# Patient Record
Sex: Male | Born: 1965 | Race: Asian | Hispanic: No | Marital: Married | Smoking: Never smoker
Health system: Southern US, Community
[De-identification: ages and names within clinical notes are randomized; demographics above are authoritative.]

## PROBLEM LIST (undated history)

## (undated) DIAGNOSIS — N200 Calculus of kidney: Secondary | ICD-10-CM

## (undated) DIAGNOSIS — E785 Hyperlipidemia, unspecified: Secondary | ICD-10-CM

## (undated) DIAGNOSIS — E119 Type 2 diabetes mellitus without complications: Secondary | ICD-10-CM

## (undated) HISTORY — DX: Hyperlipidemia, unspecified: E78.5

## (undated) HISTORY — DX: Type 2 diabetes mellitus without complications: E11.9

---

## 2003-05-10 ENCOUNTER — Emergency Department (HOSPITAL_COMMUNITY): Admission: EM | Admit: 2003-05-10 | Discharge: 2003-05-11 | Payer: Self-pay | Admitting: Emergency Medicine

## 2008-08-18 ENCOUNTER — Emergency Department (HOSPITAL_COMMUNITY): Admission: EM | Admit: 2008-08-18 | Discharge: 2008-08-18 | Payer: Self-pay | Admitting: Emergency Medicine

## 2011-08-12 LAB — CBC: WBC: 5.7

## 2011-08-12 LAB — URINALYSIS, ROUTINE W REFLEX MICROSCOPIC
Bilirubin Urine: NEGATIVE
Glucose, UA: NEGATIVE
Ketones, ur: 15 — AB
Nitrite: NEGATIVE
Protein, ur: 30 — AB
Specific Gravity, Urine: 1.024
Urobilinogen, UA: 0.2

## 2011-08-12 LAB — DIFFERENTIAL
Basophils Absolute: 0
Basophils Relative: 1
Eosinophils Relative: 2
Monocytes Relative: 6

## 2011-08-12 LAB — URINE MICROSCOPIC-ADD ON

## 2011-08-12 LAB — OCCULT BLOOD X 1 CARD TO LAB, STOOL: Fecal Occult Bld: NEGATIVE

## 2013-02-20 ENCOUNTER — Emergency Department (HOSPITAL_COMMUNITY)
Admission: EM | Admit: 2013-02-20 | Discharge: 2013-02-21 | Disposition: A | Discharge status: Home / Self Care | Payer: BC Managed Care – PPO | Attending: Emergency Medicine | Admitting: Emergency Medicine

## 2013-02-20 ENCOUNTER — Encounter (HOSPITAL_COMMUNITY): Payer: Self-pay | Admitting: *Deleted

## 2013-02-20 DIAGNOSIS — R110 Nausea: Secondary | ICD-10-CM | POA: Insufficient documentation

## 2013-02-20 DIAGNOSIS — N200 Calculus of kidney: Secondary | ICD-10-CM

## 2013-02-20 DIAGNOSIS — R319 Hematuria, unspecified: Secondary | ICD-10-CM | POA: Insufficient documentation

## 2013-02-20 HISTORY — DX: Calculus of kidney: N20.0

## 2013-02-20 LAB — COMPREHENSIVE METABOLIC PANEL
ALT: 31 U/L (ref 0–53)
AST: 21 U/L (ref 0–37)
Albumin: 4.3 g/dL (ref 3.5–5.2)
Alkaline Phosphatase: 44 U/L (ref 39–117)
CO2: 28 mEq/L (ref 19–32)
Calcium: 9.2 mg/dL (ref 8.4–10.5)
Chloride: 104 mEq/L (ref 96–112)
Creatinine, Ser: 0.86 mg/dL (ref 0.50–1.35)
GFR calc non Af Amer: >90 mL/min (ref >90)
Glucose, Bld: 156 mg/dL — ABNORMAL HIGH (ref 70–99)
Potassium: 3.8 mEq/L (ref 3.5–5.1)

## 2013-02-20 LAB — URINALYSIS, MICROSCOPIC ONLY
Bilirubin Urine: NEGATIVE
Ketones, ur: NEGATIVE mg/dL
Nitrite: NEGATIVE
Specific Gravity, Urine: 1.024 (ref 1.005–1.030)
Urobilinogen, UA: 0.2 mg/dL (ref 0.0–1.0)

## 2013-02-20 LAB — CBC WITH DIFFERENTIAL/PLATELET
Basophils Absolute: 0.1 10*3/uL (ref 0.0–0.1)
Eosinophils Absolute: 0.1 10*3/uL (ref 0.0–0.7)
HCT: 43.9 % (ref 39.0–52.0)
Hemoglobin: 15.6 g/dL (ref 13.0–17.0)
MCV: 80.1 fL (ref 78.0–100.0)
RDW: 12.2 % (ref 11.5–15.5)

## 2013-02-20 MED ORDER — HYDROMORPHONE HCL PF 1 MG/ML IJ SOLN
0.5000 mg | Freq: Once | INTRAMUSCULAR | Status: AC
  Administered 2013-02-20: 0.5 mg via INTRAVENOUS
  Filled 2013-02-20: qty 1

## 2013-02-20 MED ORDER — ONDANSETRON 4 MG PO TBDP
8.0000 mg | ORAL_TABLET | Freq: Once | ORAL | Status: AC
  Administered 2013-02-20: 8 mg via ORAL
  Filled 2013-02-20: qty 2

## 2013-02-20 MED ORDER — ONDANSETRON HCL 4 MG/2ML IJ SOLN
4.0000 mg | Freq: Once | INTRAMUSCULAR | Status: AC
  Administered 2013-02-20: 4 mg via INTRAVENOUS
  Filled 2013-02-20: qty 2

## 2013-02-20 MED ORDER — FENTANYL CITRATE 0.05 MG/ML IJ SOLN
50.0000 ug | Freq: Once | INTRAMUSCULAR | Status: AC
  Administered 2013-02-20: 50 ug via NASAL
  Filled 2013-02-20: qty 2

## 2013-02-20 NOTE — ED Notes (Signed)
Pt c/o right flank pain and hematuria x 1 day.  States he has a hx of kidney stones.  Denies n/v.

## 2013-02-21 ENCOUNTER — Emergency Department (HOSPITAL_COMMUNITY): Payer: BC Managed Care – PPO

## 2013-02-21 ENCOUNTER — Encounter (HOSPITAL_COMMUNITY)
Admission: RE | Disposition: A | Discharge status: Home / Self Care | Payer: Self-pay | Source: Ambulatory Visit | Attending: Urology

## 2013-02-21 ENCOUNTER — Ambulatory Visit (HOSPITAL_COMMUNITY): Admission: AD | Admit: 2013-02-21 | Payer: BC Managed Care – PPO | Source: Ambulatory Visit | Admitting: Urology

## 2013-02-21 ENCOUNTER — Ambulatory Visit (HOSPITAL_COMMUNITY)
Admission: RE | Admit: 2013-02-21 | Discharge: 2013-02-21 | Disposition: A | Discharge status: Home / Self Care | Payer: BC Managed Care – PPO | Source: Ambulatory Visit | Attending: Urology | Admitting: Urology

## 2013-02-21 ENCOUNTER — Encounter (HOSPITAL_COMMUNITY): Payer: Self-pay | Admitting: *Deleted

## 2013-02-21 ENCOUNTER — Ambulatory Visit (HOSPITAL_COMMUNITY): Payer: BC Managed Care – PPO

## 2013-02-21 ENCOUNTER — Other Ambulatory Visit: Payer: Self-pay | Admitting: Urology

## 2013-02-21 DIAGNOSIS — N201 Calculus of ureter: Secondary | ICD-10-CM | POA: Insufficient documentation

## 2013-02-21 DIAGNOSIS — Z79899 Other long term (current) drug therapy: Secondary | ICD-10-CM | POA: Insufficient documentation

## 2013-02-21 SURGERY — LITHOTRIPSY, ESWL
Anesthesia: LOCAL | Laterality: Right

## 2013-02-21 MED ORDER — DIAZEPAM 5 MG PO TABS
10.0000 mg | ORAL_TABLET | ORAL | Status: AC
  Administered 2013-02-21: 10 mg via ORAL
  Filled 2013-02-21: qty 2

## 2013-02-21 MED ORDER — PROMETHAZINE HCL 25 MG/ML IJ SOLN
12.5000 mg | Freq: Once | INTRAMUSCULAR | Status: AC
  Administered 2013-02-21: 12.5 mg via INTRAVENOUS
  Filled 2013-02-21: qty 1

## 2013-02-21 MED ORDER — ONDANSETRON HCL 4 MG/2ML IJ SOLN: 4.0000 mg | Freq: Four times a day (QID) | INTRAMUSCULAR | Status: DC | PRN

## 2013-02-21 MED ORDER — OXYCODONE-ACETAMINOPHEN 5-325 MG PO TABS: 2.0000 | ORAL_TABLET | ORAL | Status: DC | PRN

## 2013-02-21 MED ORDER — SODIUM CHLORIDE 0.9 % IV SOLN
250.0000 mL | INTRAVENOUS | Status: DC | PRN
  Administered 2013-02-21: 1000 mL via INTRAVENOUS

## 2013-02-21 MED ORDER — FENTANYL CITRATE 0.05 MG/ML IJ SOLN: 25.0000 ug | INTRAMUSCULAR | Status: DC | PRN

## 2013-02-21 MED ORDER — ACETAMINOPHEN 325 MG PO TABS: 650.0000 mg | ORAL_TABLET | ORAL | Status: DC | PRN

## 2013-02-21 MED ORDER — DEXTROSE-NACL 5-0.45 % IV SOLN
INTRAVENOUS | Status: DC
  Administered 2013-02-21 (×2): via INTRAVENOUS

## 2013-02-21 MED ORDER — SODIUM CHLORIDE 0.9 % IJ SOLN: 3.0000 mL | INTRAMUSCULAR | Status: DC | PRN

## 2013-02-21 MED ORDER — ACETAMINOPHEN 650 MG RE SUPP
650.0000 mg | RECTAL | Status: DC | PRN
  Filled 2013-02-21: qty 1

## 2013-02-21 MED ORDER — OXYCODONE HCL 5 MG PO TABS: 5.0000 mg | ORAL_TABLET | ORAL | Status: DC | PRN

## 2013-02-21 MED ORDER — DIPHENHYDRAMINE HCL 25 MG PO CAPS
25.0000 mg | ORAL_CAPSULE | ORAL | Status: AC
  Administered 2013-02-21: 25 mg via ORAL
  Filled 2013-02-21: qty 1

## 2013-02-21 MED ORDER — ONDANSETRON HCL 4 MG/2ML IJ SOLN
INTRAMUSCULAR | Status: AC
  Administered 2013-02-21: 4 mg via INTRAVENOUS
  Filled 2013-02-21: qty 2

## 2013-02-21 MED ORDER — CIPROFLOXACIN HCL 500 MG PO TABS
500.0000 mg | ORAL_TABLET | ORAL | Status: AC
  Administered 2013-02-21: 500 mg via ORAL
  Filled 2013-02-21: qty 1

## 2013-02-21 MED ORDER — ONDANSETRON HCL 4 MG PO TABS: 4.0000 mg | ORAL_TABLET | Freq: Four times a day (QID) | ORAL | Status: DC

## 2013-02-21 MED ORDER — SODIUM CHLORIDE 0.9 % IJ SOLN: 3.0000 mL | Freq: Two times a day (BID) | INTRAMUSCULAR | Status: DC

## 2013-02-21 NOTE — ED Notes (Signed)
The patient is AOx4 and comfortable with his discharge instructions.  His family is driving him home. 

## 2013-02-21 NOTE — Progress Notes (Addendum)
Patient urinated bright red urine. Patient's has relief from nausea and vomiting. Patient is able to tolerate small amount of fluids.

## 2013-02-21 NOTE — H&P (Signed)
istory of Present Illness  Devon Schroeder is a 47 yo Asian male who is sent from the ER with a 10x6mm right proximal stone.  He has had a prior stone and he had passed a small left ureteral stone.   He had the onset of severe right flank pain with nausea and vomiting yesterday afternoon.  He has had no fever. He has no voiding symptoms.  He has no associated signs or symptoms.   Past Medical History Problems  1. History of  Nephrolithiasis V13.01  Surgical History Problems  1. History of  No Surgical Problems  Current Meds 1. Hydrocodone-Acetaminophen 5-325 MG Oral Tablet; Therapy: (Recorded:14Apr2014) to 2. Ondansetron HCl 4 MG Oral Tablet; Therapy: (Recorded:14Apr2014) to  Allergies Medication  1. No Known Drug Allergies  Family History Problems  1. Family history of  Family Health Status Number Of Children 1 son 1 daughter 2. 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  Genitourinary: dysuria and hematuria.  Gastrointestinal: nausea and vomiting.  Neurological: headache and dizziness.    Vitals Vital Signs [Data Includes: Last 1 Day]  14Apr2014 11:27AM  BMI Calculated: 21.97 BSA Calculated: 1.74 Height: 5 ft 7 in Weight: 140 lb  Blood Pressure: 138 / 90 Temperature: 98.4 F Heart Rate: 76  Physical Exam Constitutional: Well nourished and well developed . No acute distress.  ENT:. The ears and nose are normal in appearance.  Neck: The appearance of the neck is normal and no neck mass is present.  Pulmonary: No respiratory distress and normal  respiratory rhythm and effort.  Cardiovascular: Heart rate and rhythm are normal . No peripheral edema.  Abdomen: The abdomen is soft and nontender. No masses are palpated. moderate right CVA tenderness. No hernias are palpable. No hepatosplenomegaly noted.  Lymphatics: The posterior cervical and supraclavicular nodes are not enlarged or tender.  Skin: Normal skin turgor, no visible rash and no visible skin lesions.  Neuro/Psych:. Mood and affect are appropriate. No motor deficits.    Results/Data Urine [Data Includes: Last 1 Day]   14Apr2014  COLOR AMBER   APPEARANCE CLOUDY   SPECIFIC GRAVITY >1.030   pH 6.0   GLUCOSE 100 mg/dL  BILIRUBIN NEG   KETONE NEG mg/dL  BLOOD LARGE   PROTEIN TRACE mg/dL  UROBILINOGEN 0.2 mg/dL  NITRITE NEG   LEUKOCYTE ESTERASE NEG   SQUAMOUS EPITHELIAL/HPF RARE   WBC 3-6 WBC/hpf  RBC TNTC RBC/hpf  BACTERIA FEW   CRYSTALS NONE SEEN   CASTS NONE SEEN    Old records or history reviewed: I have reviewed his ER records along with the labs and CT films and report. He has a WBC of 13 and a glucose of 159.    Assessment Assessed  1. Proximal Ureteral Stone On The Right 592.1   He has a right proximal stone with obstruction pain and nausea.   Plan Health Maintenance (V70.0)  1. UA With REFLEX  Done: 14Apr2014 11:07AM   I discussed ESWL and ureteroscopy as the stone is unlikely to pass.   He will be set up for ESWL today.  I reviewed the risks of bleeding, infection, ureteral injury, injury to  adjacent structures,  failure of fragmentation, need for secondary procedures, thrombotic risks and anesthetic complications.

## 2013-02-21 NOTE — ED Provider Notes (Signed)
History     CSN: [id number]  Arrival date & time 02/20/13  2023   First MD Initiated Contact with Patient 02/20/13 2303      Chief Complaint  Patient presents with  . Flank Pain    (Consider location/radiation/quality/duration/timing/severity/associated sxs/prior treatment) HPI 47 year old male presents to the emergency department with complaint of right flank pain for one day.  Patient reports history of kidney stone, last about 5 years ago.  He has never been seen by her urologist, and as always passed the stones on its own.  He denies any fever or chills.  Has had nausea, but no vomiting.  Pain starts in his right back and is wrapping around his right flank.  No other complaints at this time.  Symptoms are similar to his prior kidney stone.  Past Medical History  Diagnosis Date  . Kidney stone     History reviewed. No pertinent past surgical history.  History reviewed. No pertinent family history.  History  Substance Use Topics  . Smoking status: Never Smoker   . Smokeless tobacco: Not on file  . Alcohol Use: No      Review of Systems  See History of Present Illness; otherwise all other systems are reviewed and negative  Allergies  Review of patient's allergies indicates no known allergies.  Home Medications   Current Outpatient Rx  Name  Route  Sig  Dispense  Refill  . ondansetron (ZOFRAN) 4 MG tablet   Oral   Take 1 tablet (4 mg total) by mouth every 6 (six) hours. PRN nausea   12 tablet   0   . oxyCODONE-acetaminophen (PERCOCET/ROXICET) 5-325 MG per tablet   Oral   Take 2 tablets by mouth every 4 (four) hours as needed for pain.   20 tablet   0     BP 151/102  Pulse 81  Temp(Src) 98.7 F (37.1 C) (Oral)  Resp 18  Ht 5\' 7"  (1.702 m)  Wt 139 lb 8 oz (63.277 kg)  BMI 21.84 kg/m2  SpO2 95%  Physical Exam  Nursing note and vitals reviewed. Constitutional: He is oriented to person, place, and time. He appears well-developed and well-nourished.  He appears distressed (uncomfortable appearing).  HENT:  Head: Normocephalic and atraumatic.  Nose: Nose normal.  Mouth/Throat: Oropharynx is clear and moist.  Eyes: Conjunctivae and EOM are normal. Pupils are equal, round, and reactive to light.  Neck: Normal range of motion. Neck supple. No JVD present. No tracheal deviation present. No thyromegaly present.  Cardiovascular: Normal rate, regular rhythm, normal heart sounds and intact distal pulses.  Exam reveals no gallop and no friction rub.   No murmur heard. Pulmonary/Chest: Effort normal and breath sounds normal. No stridor. No respiratory distress. He has no wheezes. He has no rales. He exhibits no tenderness.  Abdominal: Soft. Bowel sounds are normal. He exhibits no distension and no mass. There is tenderness (right CVA tenderness). There is no rebound and no guarding.  Musculoskeletal: Normal range of motion. He exhibits no edema and no tenderness.  Lymphadenopathy:    He has no cervical adenopathy.  Neurological: He is alert and oriented to person, place, and time. He exhibits normal muscle tone. Coordination normal.  Skin: Skin is warm and dry. No rash noted. No erythema. No pallor.  Psychiatric: He has a normal mood and affect. His behavior is normal. Judgment and thought content normal.    ED Course  Procedures (including critical care time)  Labs Reviewed  CBC WITH DIFFERENTIAL -  Abnormal; Notable for the following:    WBC 13.1 (*)    Neutro Abs 9.2 (*)    All other components within normal limits  COMPREHENSIVE METABOLIC PANEL - Abnormal; Notable for the following:    Glucose, Bld 156 (*)    All other components within normal limits  URINALYSIS, MICROSCOPIC ONLY - Abnormal; Notable for the following:    Color, Urine RED (*)    APPearance TURBID (*)    Hgb urine dipstick LARGE (*)    Protein, ur 30 (*)    Leukocytes, UA SMALL (*)    All other components within normal limits   Ct Abdomen Pelvis Wo  Contrast  02/21/2013  *RADIOLOGY REPORT*  Clinical Data: Right flank pain and hematuria; history of renal stones.  CT ABDOMEN AND PELVIS WITHOUT CONTRAST  Technique:  Multidetector CT imaging of the abdomen and pelvis was performed following the standard protocol without intravenous contrast.  Comparison: CT of the abdomen and pelvis performed 08/18/2008  Findings: Minimal bibasilar atelectasis is noted.  The liver and spleen are unremarkable in appearance.  The gallbladder is within normal limits.  The pancreas and adrenal glands are unremarkable.  There is mild right-sided hydronephrosis, with an obstructing large 10 x 7 mm stone noted in the proximal right ureter, just below the right renal pelvis.  Significant perinephric stranding and fluid is noted on the right side.  Scattered tiny nonobstructing bilateral renal stones are seen.  The left kidney is otherwise unremarkable in appearance.  No free fluid is identified.  The small bowel is unremarkable in appearance.  The stomach is within normal limits.  No acute vascular abnormalities are seen.  The appendix is normal in caliber, without evidence for appendicitis.  The colon is grossly unremarkable in appearance.  The bladder is mildly distended and grossly unremarkable; a tiny urachal remnant is incidentally seen.  The prostate is mildly enlarged, measuring 5.1 cm in transverse dimension, with scattered calcification.  No inguinal lymphadenopathy is seen.  No acute osseous abnormalities are identified.  IMPRESSION:  1.  Mild right-sided hydronephrosis, with a large obstructing 10 x 7 mm stone noted in the proximal right ureter, just below the right renal pelvis.  Significant associated perinephric stranding and fluid seen. 2.  Scattered tiny nonobstructing bilateral renal stones noted. 3.  Mildly enlarged prostate.   Original Report Authenticated By: Tonia Ghent, M.D.      1. Kidney stone on right side       MDM  47 year old male with right-sided  kidney stone.  Discuss with patient and family that he would need close follow up with urology as the stone is larger than a size that would normally pass on its own.  Patient given followup information to urology.  He was also instructed that should he have any complications with the stone, it is best that he go to [facility] emergency department.  Patient is pain-free at this time, and given pain and nausea medicine prescriptions, for discharge.        Olivia Mackie, MD 02/21/13 [phone]

## 2014-01-21 IMAGING — CT CT ABD-PELV W/O CM
2 of 4 series · 13 of 32 positions shown, 19 images · non-contrast
Comparison: CT of the abdomen and pelvis performed 08/18/2008

CLINICAL DATA: Right flank pain and hematuria; history of renal
stones.

CT ABDOMEN AND PELVIS WITHOUT CONTRAST
TECHNIQUE: Multidetector CT imaging of the abdomen and pelvis was
performed following the standard protocol without intravenous
contrast.

[Series 3: routine abdomen · axial · 0.70mm/px · z∈[-489,-134]mm · 9 of 89 slices shown, 15 images]
[im 9/89  soft-tissue]
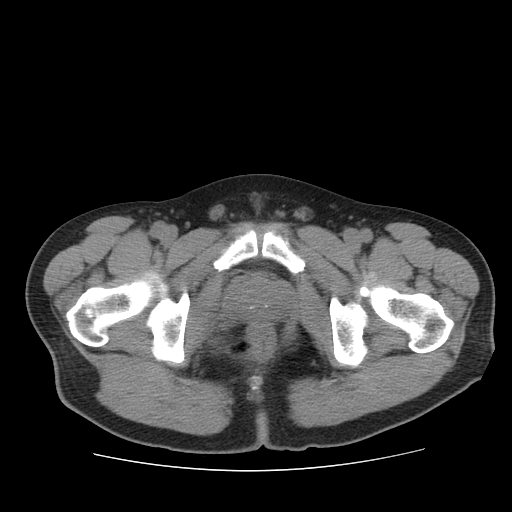
[im 9/89  bone]
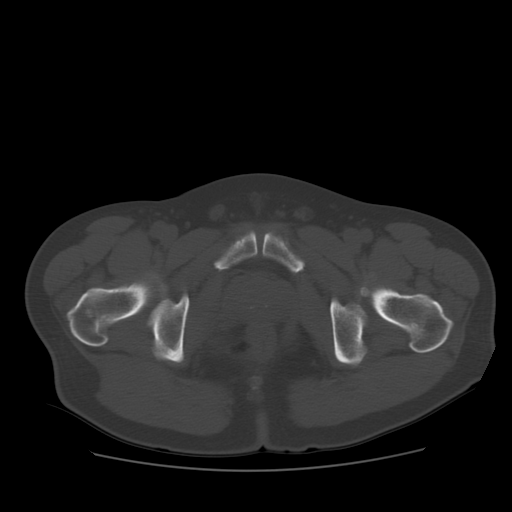
[im 18/89  soft-tissue]
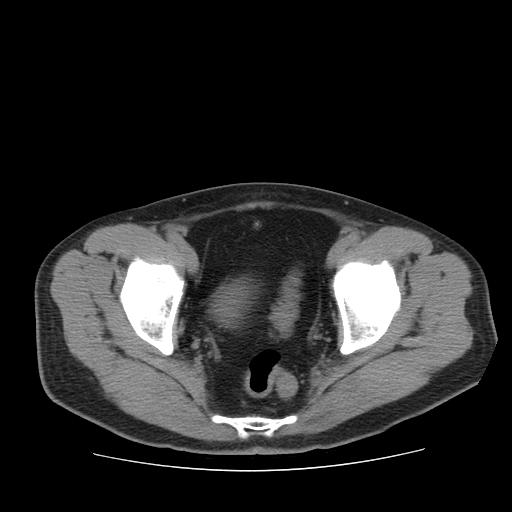
[im 27/89  soft-tissue]
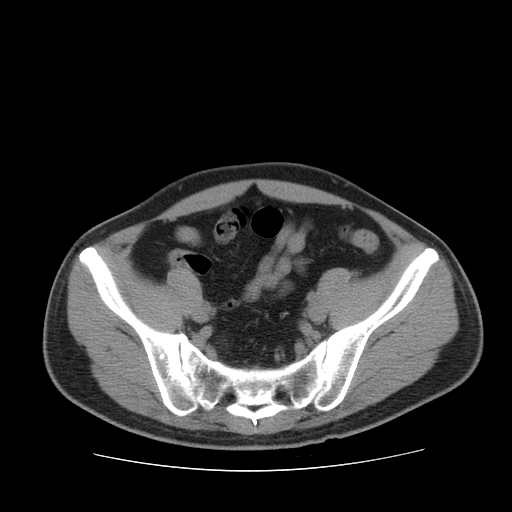
[im 36/89  soft-tissue]
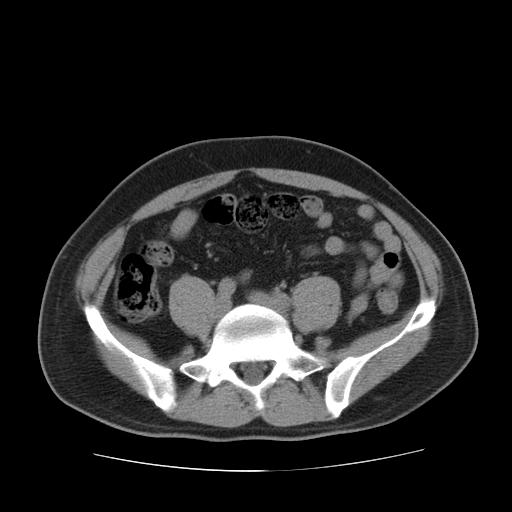
[im 45/89  soft-tissue]
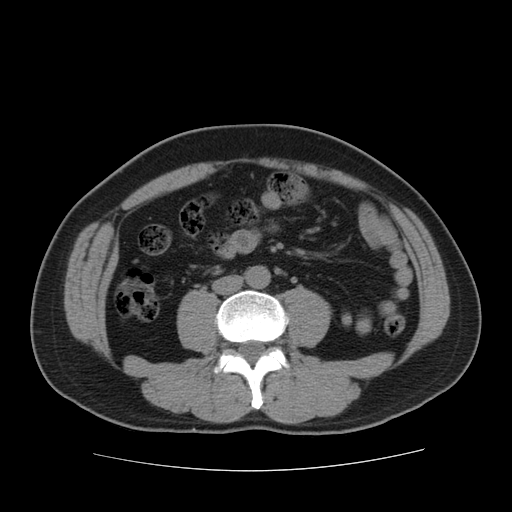
[im 53/89  soft-tissue]
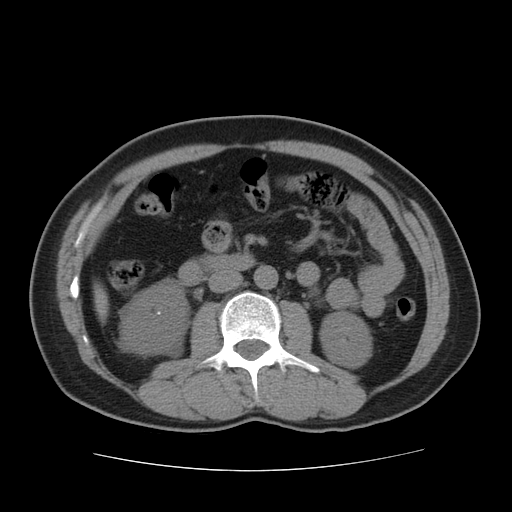
[im 53/89  lung]
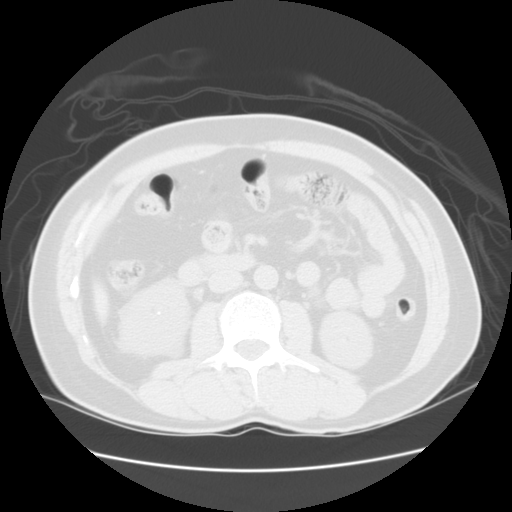
[im 62/89  soft-tissue]
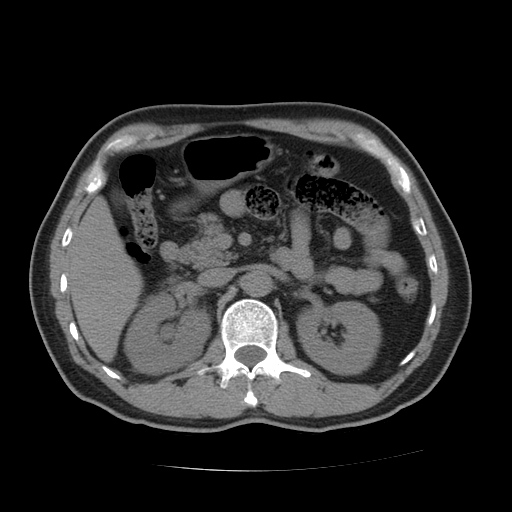
[im 62/89  lung]
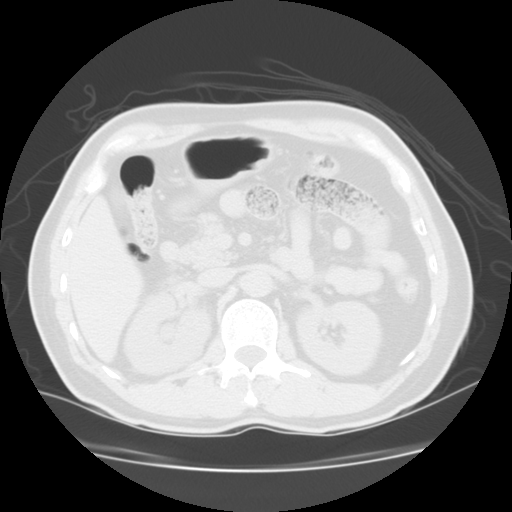
[im 71/89  soft-tissue]
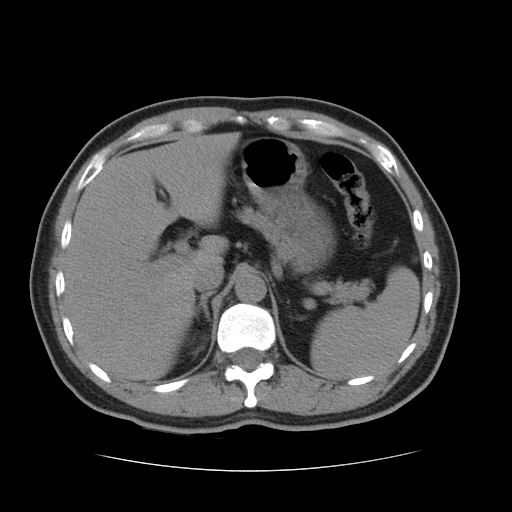
[im 71/89  lung]
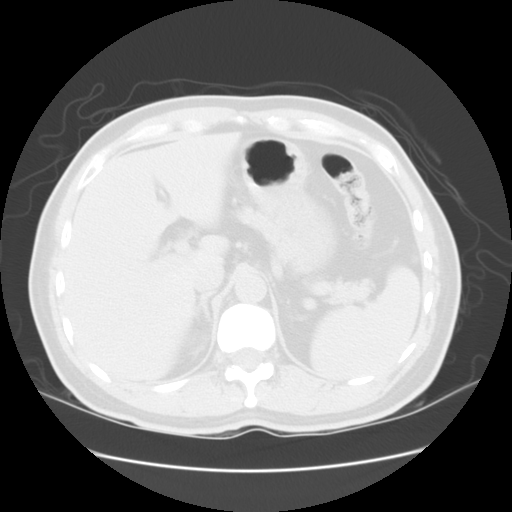
[im 80/89  soft-tissue]
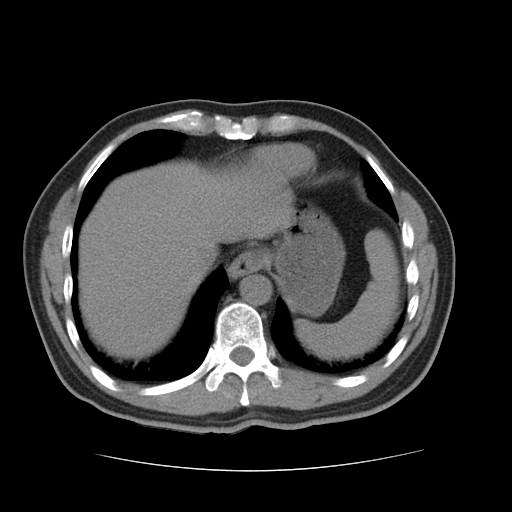
[im 80/89  lung]
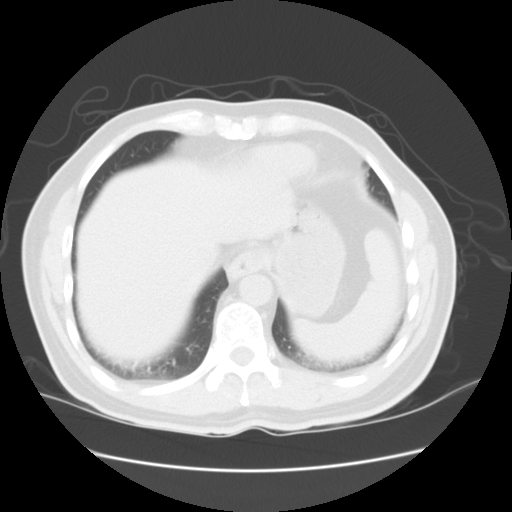
[im 80/89  bone]
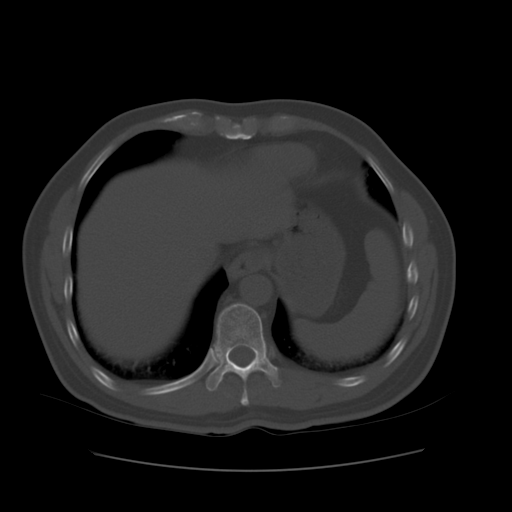

[Series 501: sagittals · sagittal · 0.96mm/px · 4 of 93 slices shown]
[im 11/93  soft-tissue]
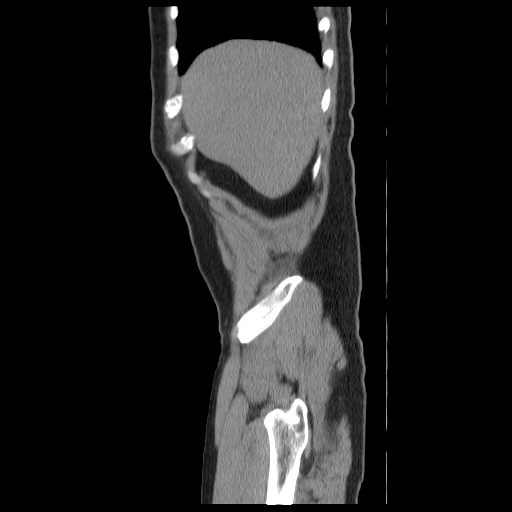
[im 21/93  soft-tissue]
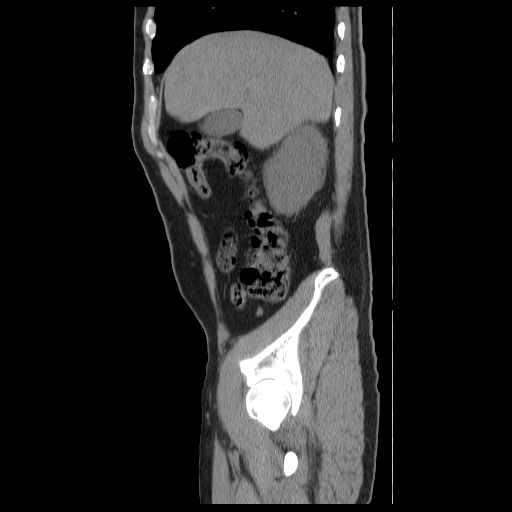
[im 31/93  soft-tissue]
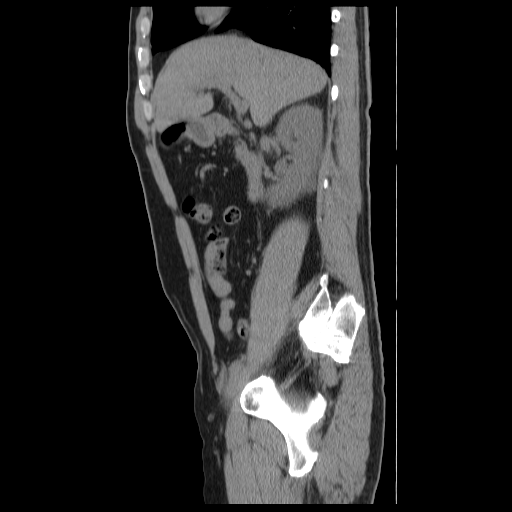
[im 41/93  soft-tissue]
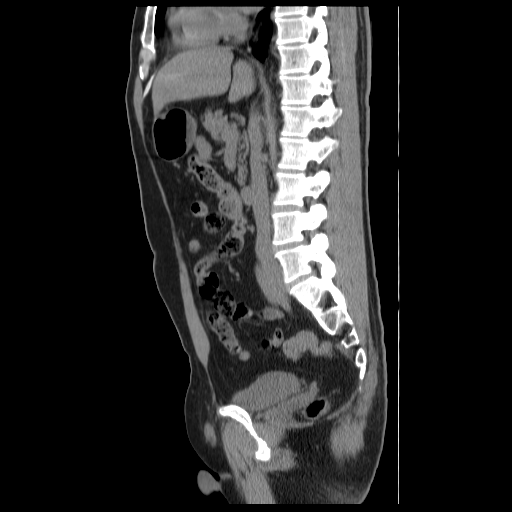

[13 of 32 positions shown; findings below may reference images not displayed]

FINDINGS: Minimal bibasilar atelectasis is noted.

The liver and spleen are unremarkable in appearance.  The
gallbladder is within normal limits.  The pancreas and adrenal
glands are unremarkable.

There is mild right-sided hydronephrosis, with an obstructing large
10 x 7 mm stone noted in the proximal right ureter, just below the
right renal pelvis.  Significant perinephric stranding and fluid is
noted on the right side.

Scattered tiny nonobstructing bilateral renal stones are seen.  The
left kidney is otherwise unremarkable in appearance.

No free fluid is identified.  The small bowel is unremarkable in
appearance.  The stomach is within normal limits.  No acute
vascular abnormalities are seen.

The appendix is normal in caliber, without evidence for
appendicitis.  The colon is grossly unremarkable in appearance.

The bladder is mildly distended and grossly unremarkable; a tiny
urachal remnant is incidentally seen.  The prostate is mildly
enlarged, measuring 5.1 cm in transverse dimension, with scattered
calcification.  No inguinal lymphadenopathy is seen.

No acute osseous abnormalities are identified.
IMPRESSION: 1.  Mild right-sided hydronephrosis, with a large obstructing 10 x
7 mm stone noted in the proximal right ureter, just below the right
renal pelvis.  Significant associated perinephric stranding and
fluid seen.
2.  Scattered tiny nonobstructing bilateral renal stones noted.
3.  Mildly enlarged prostate.

## 2016-02-14 DIAGNOSIS — J069 Acute upper respiratory infection, unspecified: Secondary | ICD-10-CM | POA: Diagnosis not present

## 2016-02-26 DIAGNOSIS — K2970 Gastritis, unspecified, without bleeding: Secondary | ICD-10-CM | POA: Diagnosis not present

## 2016-03-05 DIAGNOSIS — R630 Anorexia: Secondary | ICD-10-CM | POA: Diagnosis not present

## 2016-10-05 DIAGNOSIS — J029 Acute pharyngitis, unspecified: Secondary | ICD-10-CM | POA: Diagnosis not present

## 2016-10-05 DIAGNOSIS — J069 Acute upper respiratory infection, unspecified: Secondary | ICD-10-CM | POA: Diagnosis not present

## 2016-10-08 DIAGNOSIS — J209 Acute bronchitis, unspecified: Secondary | ICD-10-CM | POA: Diagnosis not present

## 2016-10-22 DIAGNOSIS — R05 Cough: Secondary | ICD-10-CM | POA: Diagnosis not present

## 2016-10-22 DIAGNOSIS — J209 Acute bronchitis, unspecified: Secondary | ICD-10-CM | POA: Diagnosis not present

## 2016-11-07 DIAGNOSIS — J069 Acute upper respiratory infection, unspecified: Secondary | ICD-10-CM | POA: Diagnosis not present

## 2016-11-07 DIAGNOSIS — R1013 Epigastric pain: Secondary | ICD-10-CM | POA: Diagnosis not present

## 2016-11-07 DIAGNOSIS — J209 Acute bronchitis, unspecified: Secondary | ICD-10-CM | POA: Diagnosis not present

## 2016-11-07 DIAGNOSIS — R6881 Early satiety: Secondary | ICD-10-CM | POA: Diagnosis not present

## 2016-12-12 DIAGNOSIS — Z1211 Encounter for screening for malignant neoplasm of colon: Secondary | ICD-10-CM | POA: Diagnosis not present

## 2016-12-12 DIAGNOSIS — Z125 Encounter for screening for malignant neoplasm of prostate: Secondary | ICD-10-CM | POA: Diagnosis not present

## 2016-12-12 DIAGNOSIS — Z0000 Encounter for general adult medical examination without abnormal findings: Secondary | ICD-10-CM | POA: Diagnosis not present

## 2017-02-14 DIAGNOSIS — B0223 Postherpetic polyneuropathy: Secondary | ICD-10-CM | POA: Diagnosis not present

## 2017-04-17 DIAGNOSIS — R7301 Impaired fasting glucose: Secondary | ICD-10-CM | POA: Diagnosis not present

## 2017-04-17 DIAGNOSIS — K120 Recurrent oral aphthae: Secondary | ICD-10-CM | POA: Diagnosis not present

## 2017-04-17 DIAGNOSIS — J309 Allergic rhinitis, unspecified: Secondary | ICD-10-CM | POA: Diagnosis not present

## 2017-07-09 DIAGNOSIS — H10021 Other mucopurulent conjunctivitis, right eye: Secondary | ICD-10-CM | POA: Diagnosis not present

## 2018-04-02 DIAGNOSIS — R05 Cough: Secondary | ICD-10-CM | POA: Diagnosis not present

## 2018-04-06 DIAGNOSIS — R05 Cough: Secondary | ICD-10-CM | POA: Diagnosis not present

## 2018-04-27 ENCOUNTER — Ambulatory Visit
Admission: RE | Admit: 2018-04-27 | Discharge: 2018-04-27 | Disposition: A | Discharge status: Home / Self Care | Payer: BLUE CROSS/BLUE SHIELD | Source: Ambulatory Visit | Attending: Family Medicine | Admitting: Family Medicine

## 2018-04-27 ENCOUNTER — Other Ambulatory Visit: Payer: Self-pay | Admitting: Family Medicine

## 2018-04-27 DIAGNOSIS — R059 Cough, unspecified: Secondary | ICD-10-CM

## 2018-04-27 DIAGNOSIS — R05 Cough: Secondary | ICD-10-CM

## 2018-05-04 DIAGNOSIS — R05 Cough: Secondary | ICD-10-CM | POA: Diagnosis not present

## 2018-07-05 DIAGNOSIS — Z1159 Encounter for screening for other viral diseases: Secondary | ICD-10-CM | POA: Diagnosis not present

## 2018-07-05 DIAGNOSIS — Z0000 Encounter for general adult medical examination without abnormal findings: Secondary | ICD-10-CM | POA: Diagnosis not present

## 2018-07-05 DIAGNOSIS — Z1211 Encounter for screening for malignant neoplasm of colon: Secondary | ICD-10-CM | POA: Diagnosis not present

## 2018-07-05 DIAGNOSIS — Z125 Encounter for screening for malignant neoplasm of prostate: Secondary | ICD-10-CM | POA: Diagnosis not present

## 2018-11-05 DIAGNOSIS — J029 Acute pharyngitis, unspecified: Secondary | ICD-10-CM | POA: Diagnosis not present

## 2018-11-05 DIAGNOSIS — J069 Acute upper respiratory infection, unspecified: Secondary | ICD-10-CM | POA: Diagnosis not present

## 2018-11-13 DIAGNOSIS — R112 Nausea with vomiting, unspecified: Secondary | ICD-10-CM | POA: Diagnosis not present

## 2018-11-25 DIAGNOSIS — K219 Gastro-esophageal reflux disease without esophagitis: Secondary | ICD-10-CM | POA: Diagnosis not present

## 2018-11-25 DIAGNOSIS — R1084 Generalized abdominal pain: Secondary | ICD-10-CM | POA: Diagnosis not present

## 2018-11-25 DIAGNOSIS — R112 Nausea with vomiting, unspecified: Secondary | ICD-10-CM | POA: Diagnosis not present

## 2019-03-28 IMAGING — DX DG CHEST 2V
2 series · 2 of 2 positions shown · non-contrast
Comparison: None.

CLINICAL DATA: 2-3 months of productive cough. No history of
cardiopulmonary disease. Known kidney stone.

EXAM:
CHEST - 2 VIEW

[dg chest 2 view (1 of 2)]
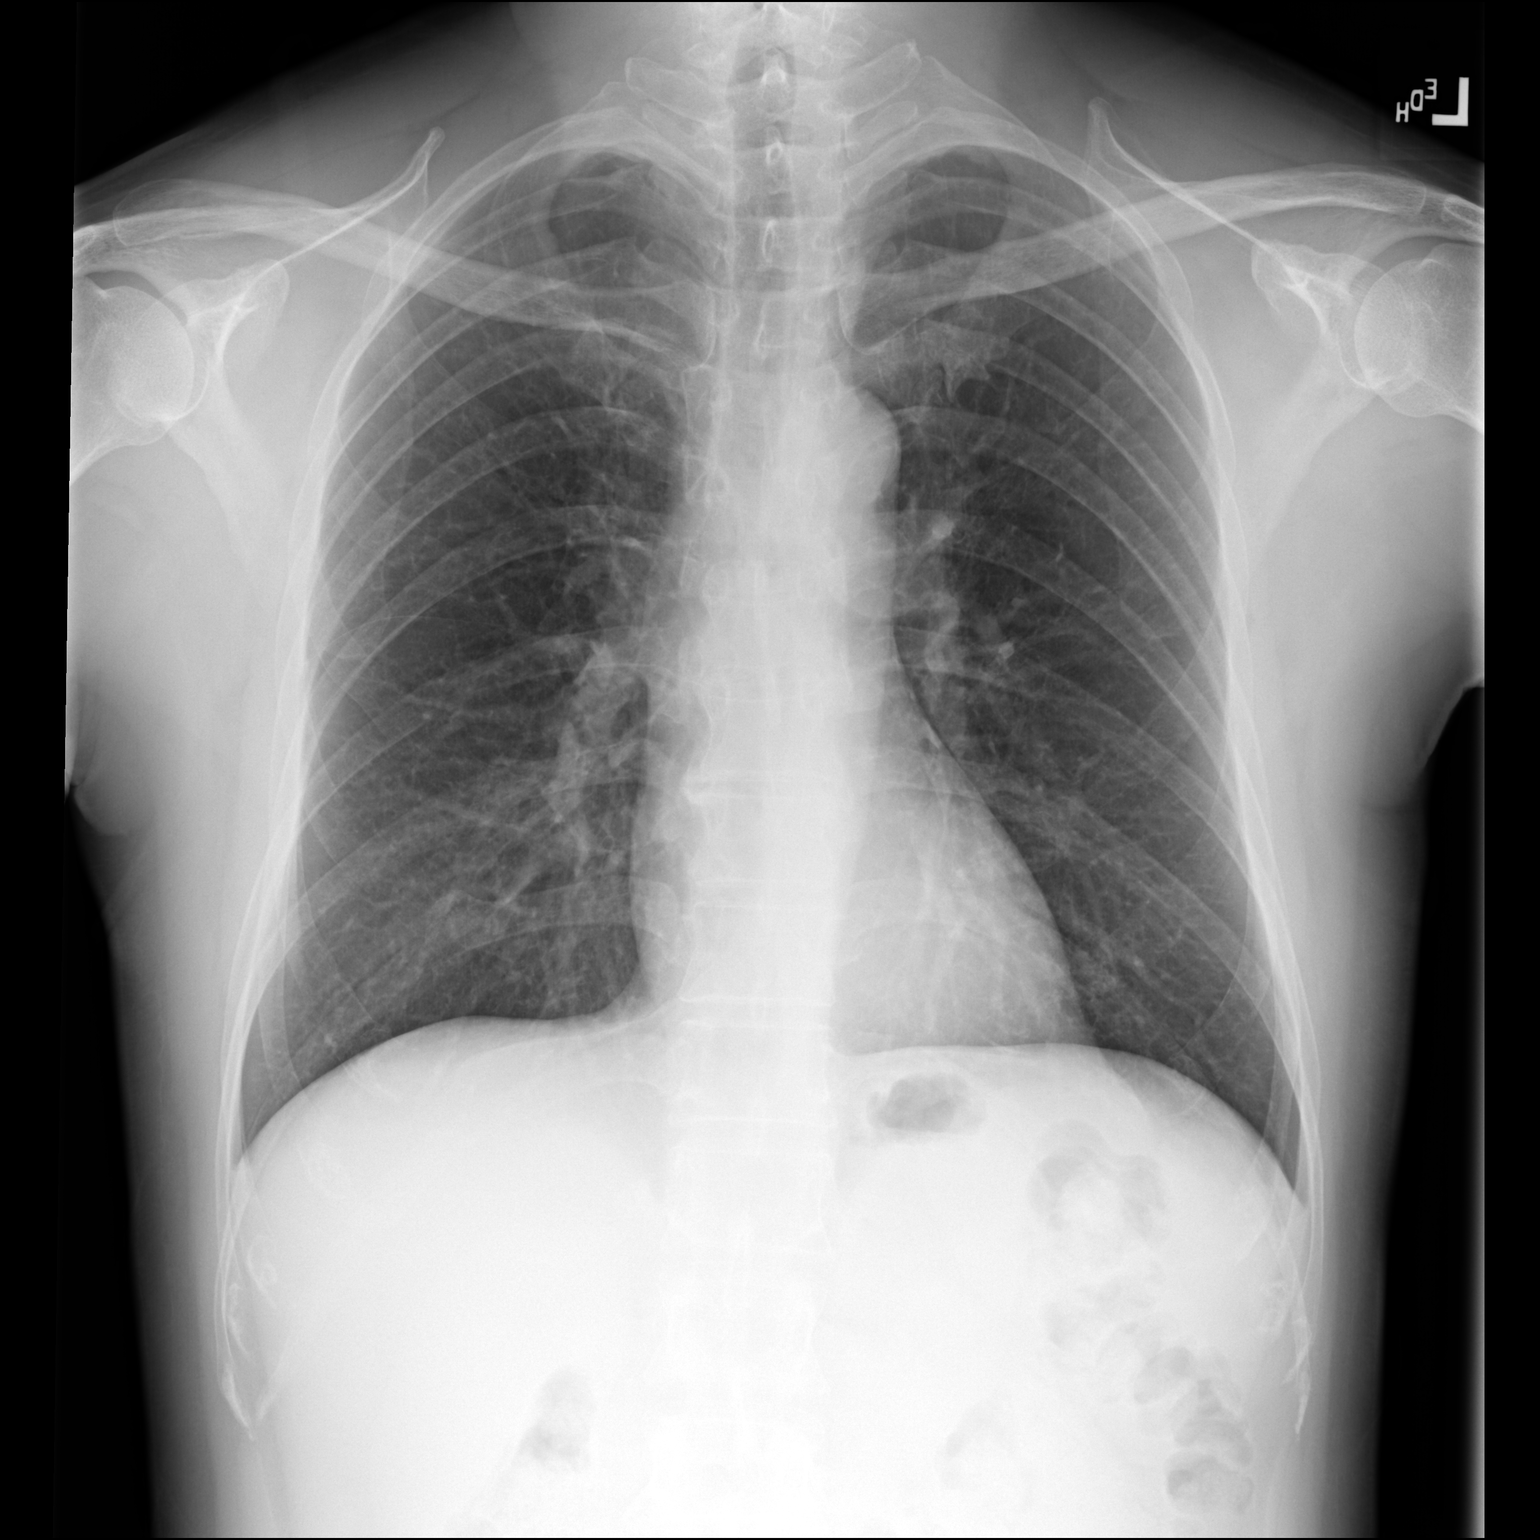

[dg chest 2 view (2 of 2)]
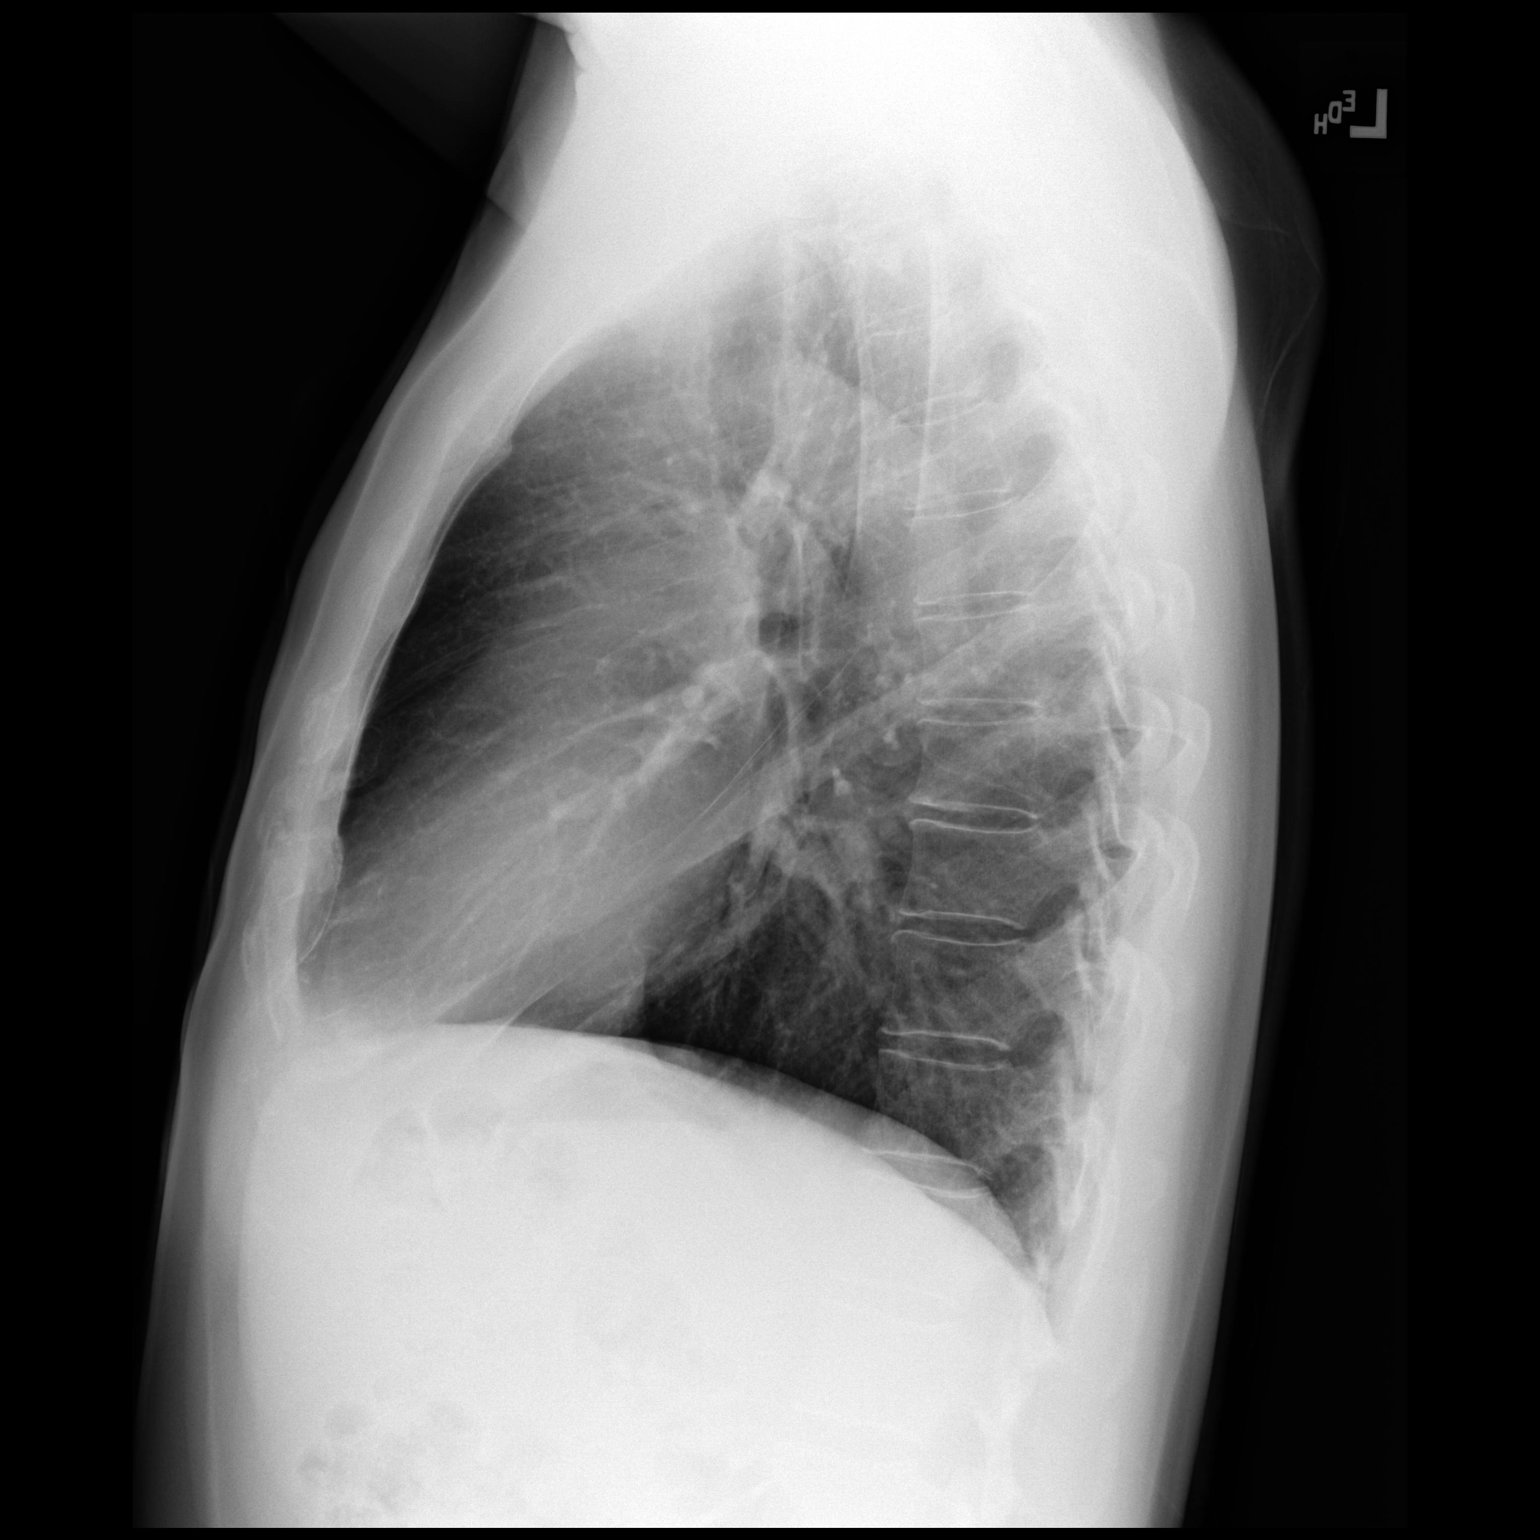

[2 of 2 positions shown; findings below may reference images not displayed]

FINDINGS: The lungs are well-expanded and clear. The heart and pulmonary
vascularity are normal. The mediastinum is normal in width. There is
no pleural effusion. The trachea is midline. The bony thorax is
unremarkable.
IMPRESSION: There is no active cardiopulmonary disease.

## 2019-07-11 DIAGNOSIS — Z0000 Encounter for general adult medical examination without abnormal findings: Secondary | ICD-10-CM | POA: Diagnosis not present

## 2021-09-26 ENCOUNTER — Ambulatory Visit: Payer: Self-pay | Admitting: Allergy

## 2021-10-30 ENCOUNTER — Ambulatory Visit: Payer: Self-pay | Admitting: Allergy

## 2022-01-06 NOTE — Progress Notes (Unsigned)
New Patient Note  RE: Devon Schroeder MRN: [id number] DOB: 1966/09/14 Date of Office Visit: 01/07/2022  Consult requested by: Clovis Riley, L.August Saucer, MD Primary care provider: Clovis Riley, Elbert Ewings.August Saucer, MD  Chief Complaint: No chief complaint on file.  History of Present Illness: I had the pleasure of seeing Devon Schroeder for initial evaluation at the [facility] on 01/06/2022. He is a 56 y.o. male, who is referred here by Clovis Riley, L.August Saucer, MD for the evaluation of rash.  Rash started about *** ago. Mainly occurs on his ***. Describes them as ***. Individual rashes lasts about ***. No ecchymosis upon resolution. Associated symptoms include: ***.  Frequency of episodes: ***. Suspected triggers are ***. Denies any *** fevers, chills, changes in medications, foods, personal care products or recent infections. He has tried the following therapies: *** with *** benefit. Systemic steroids ***. Currently on ***.  Previous work up includes: ***. Previous history of rash/hives: ***. Patient is up to date with the following cancer screening tests: ***.  Assessment and Plan: Devon Schroeder is a 56 y.o. male with: No problem-specific Assessment & Plan notes found for this encounter.  No follow-ups on file.  No orders of the defined types were placed in this encounter.  Lab Orders  No laboratory test(s) ordered today    Other allergy screening: Asthma: {Blank single:19197::"yes","no"} Rhino conjunctivitis: {Blank single:19197::"yes","no"} Food allergy: {Blank single:19197::"yes","no"} Medication allergy: {Blank single:19197::"yes","no"} Hymenoptera allergy: {Blank single:19197::"yes","no"} Urticaria: {Blank single:19197::"yes","no"} Eczema:{Blank single:19197::"yes","no"} History of recurrent infections suggestive of immunodeficency: {Blank single:19197::"yes","no"}  Diagnostics: Spirometry:  Tracings reviewed. His effort: {Blank single:19197::"Good reproducible efforts.","It was hard to get  consistent efforts and there is a question as to whether this reflects a maximal maneuver.","Poor effort, data can not be interpreted."} FVC: ***L FEV1: ***L, ***% predicted FEV1/FVC ratio: ***% Interpretation: {Blank single:19197::"Spirometry consistent with mild obstructive disease","Spirometry consistent with moderate obstructive disease","Spirometry consistent with severe obstructive disease","Spirometry consistent with possible restrictive disease","Spirometry consistent with mixed obstructive and restrictive disease","Spirometry uninterpretable due to technique","Spirometry consistent with normal pattern","No overt abnormalities noted given today's efforts"}.  Please see scanned spirometry results for details.  Skin Testing: {Blank single:19197::"Select foods","Environmental allergy panel","Environmental allergy panel and select foods","Food allergy panel","None","Deferred due to recent antihistamines use"}. *** Results discussed with patient/family.   Past Medical History: There are no problems to display for this patient.  Past Medical History:  Diagnosis Date   Kidney stone    Past Surgical History: No past surgical history on file. Medication List:  Current Outpatient Medications  Medication Sig Dispense Refill   ondansetron (ZOFRAN) 4 MG tablet Take 1 tablet (4 mg total) by mouth every 6 (six) hours. PRN nausea 12 tablet 0   oxyCODONE-acetaminophen (PERCOCET/ROXICET) 5-325 MG per tablet Take 2 tablets by mouth every 4 (four) hours as needed for pain. 20 tablet 0   No current facility-administered medications for this visit.   Allergies: No Known Allergies Social History: Social History   Socioeconomic History   Marital status: Married    Spouse name: Not on file   Number of children: Not on file   Years of education: Not on file   Highest education level: Not on file  Occupational History   Not on file  Tobacco Use   Smoking status: Never   Smokeless  tobacco: Not on file  Substance and Sexual Activity   Alcohol use: No   Drug use: No   Sexual activity: Not on file  Other Topics Concern   Not on file  Social History Narrative  Not on file   Social Determinants of Health   Financial Resource Strain: Not on file  Food Insecurity: Not on file  Transportation Needs: Not on file  Physical Activity: Not on file  Stress: Not on file  Social Connections: Not on file   Lives in a ***. Smoking: *** Occupation: ***  Environmental HistoryFreight forwarder in the house: Estate agent in the family room: {Blank single:19197::"yes","no"} Carpet in the bedroom: {Blank single:19197::"yes","no"} Heating: {Blank single:19197::"electric","gas","heat pump"} Cooling: {Blank single:19197::"central","window","heat pump"} Pet: {Blank single:19197::"yes ***","no"}  Family History: No family history on file. Problem                               Relation Asthma                                   *** Eczema                                *** Food allergy                          *** Allergic rhino conjunctivitis     ***  Review of Systems  Constitutional:  Negative for appetite change, chills, fever and unexpected weight change.  HENT:  Negative for congestion and rhinorrhea.   Eyes:  Negative for itching.  Respiratory:  Negative for cough, chest tightness, shortness of breath and wheezing.   Cardiovascular:  Negative for chest pain.  Gastrointestinal:  Negative for abdominal pain.  Genitourinary:  Negative for difficulty urinating.  Skin:  Negative for rash.  Neurological:  Negative for headaches.   Objective: There were no vitals taken for this visit. There is no height or weight on file to calculate BMI. Physical Exam Vitals and nursing note reviewed.  Constitutional:      Appearance: Normal appearance. He is well-developed.  HENT:     Head: Normocephalic and atraumatic.     Right Ear: Tympanic  membrane and external ear normal.     Left Ear: Tympanic membrane and external ear normal.     Nose: Nose normal.     Mouth/Throat:     Mouth: Mucous membranes are moist.     Pharynx: Oropharynx is clear.  Eyes:     Conjunctiva/sclera: Conjunctivae normal.  Cardiovascular:     Rate and Rhythm: Normal rate and regular rhythm.     Heart sounds: Normal heart sounds. No murmur heard.   No friction rub. No gallop.  Pulmonary:     Effort: Pulmonary effort is normal.     Breath sounds: Normal breath sounds. No wheezing, rhonchi or rales.  Musculoskeletal:     Cervical back: Neck supple.  Skin:    General: Skin is warm.     Findings: No rash.  Neurological:     Mental Status: He is alert and oriented to person, place, and time.  Psychiatric:        Behavior: Behavior normal.  The plan was reviewed with the patient/family, and all questions/concerned were addressed.  It was my pleasure to see Kysin today and participate in his care. Please feel free to contact me with any questions or concerns.  Sincerely,  Rexene Alberts, DO Allergy & Immunology  [facility] [place] [facility]  office: [place] office: [phone]

## 2022-01-07 ENCOUNTER — Ambulatory Visit: Payer: Self-pay | Admitting: Allergy

## 2022-01-22 NOTE — Progress Notes (Signed)
? ?New Patient Note ? ?RE: Devon Schroeder MRN: [id number] DOB: 1966-04-16 ?Date of Office Visit: 01/23/2022 ? ?Consult requested by: Clovis Riley, L.August Saucer, MD ?Primary care provider: Clovis Riley, Elbert Ewings.August Saucer, MD ? ?Chief Complaint: Pruritus (X 3 months) ? ?History of Present Illness: ?I had the pleasure of seeing Devon Schroeder for initial evaluation at the [facility] on 01/23/2022. He is a 56 y.o. male, who is referred here by Clovis Riley, L.August Saucer, MD for the evaluation of rash. ? ?Declines interpreter services.  ? ?Patient had issues with itching and rash starting in November through January. The itching improved but still having rash.  ? ?Mainly occurs on his torso and arms. Describes them as itchy, red bumps. Individual rashes lasts about 1 month. No ecchymosis upon resolution. Associated symptoms include: none.  ?Suspected triggers are unknown. Denies any fevers, chills, changes in medications, foods, personal care products or recent infections. He has tried the following therapies: permethrin and triamcinolone with some benefit. Systemic steroids  - maybe. Currently on no daily meds.  ?Previous work up includes: January 2023 BMP, HgA1C and PSA was unremarkable.  ?Previous history of rash/hives: no. ?Patient is up to date with the following cancer screening tests: colonoscopy, physical exam. ? ?Assessment and Plan: ?Reginal is a 56 y.o. male with: ?Rash and other nonspecific skin eruption ?Pruritic rash started in November. Pruritis almost resolved but still has some rash. Tried topical permethrin and triamcinolone cream with some benefit. It's unclear if he a steroid injection for this or not. Denies changes in diet, meds or personal care products. Concerned if he developed any allergies. ?Today's skin prick testing showed: Borderline positive to grass pollen only. No indoor allergens. Negative to select foods.  ?Discussed with patient that grass pollinates in the late spring/summer months which does not explain his  clinical timeline.  ?Not sure what's caused the rash/itching.   ?Take zyrtec (cetirizine) 10mg  or allegra 180mg  once a day if needed for itching.  ?Avoid the following potential triggers: alcohol, tight clothing, NSAIDs, hot showers and getting overheated. ?Get bloodwork if no improvement after 1 month.  ? ?Pruritus ?See assessment and plan as above. ? ?Other atopic dermatitis ?Rash on the antecubital fossa area consistent with eczema. ?Use triamcinolone 0.1% ointment twice a day as needed for rash flares. Do not use on the face, neck, armpits or groin area. Do not use more than 3 weeks in a row.  ? ?Return in about 2 months (around 03/25/2022). ? ?Meds ordered this encounter  ?Medications  ? triamcinolone ointment (KENALOG) 0.1 %  ?  Sig: Apply 1 application. topically 2 (two) times daily as needed (rash flare). Do not use on the face, neck, armpits or groin area. Do not use more than 3 weeks in a row.  ?  Dispense:  30 g  ?  Refill:  0  ? ?Lab Orders    ?     ANA w/Reflex    ?     Alpha-Gal Panel    ?     CBC with Differential/Platelet    ?     Comprehensive metabolic panel    ?     C-reactive protein    ?     Sedimentation rate    ?     Tryptase    ?     Thyroid Cascade Profile    ? ? ?Other allergy screening: ?Asthma: no ?Rhino conjunctivitis: no ?Food allergy: no ?Medication allergy: no ?Hymenoptera allergy: no ?Eczema:no ?History of recurrent infections  suggestive of immunodeficency: no ? ?Diagnostics: ?Skin Testing: Environmental allergy panel and select foods. ?Borderline positive to grass pollen only. No indoor allergens. ?Negative to select foods.  ?Results discussed with patient/family. ? Pediatric Percutaneous Testing - 01/23/22 1427   ? ? Time Antigen Placed 1427   ? Allergen Manufacturer Waynette Buttery   ? Location Back   ? Number of Test 31   ? Pediatric Panel Foods   ? 1. Control-buffer 50% Glycerol Negative   ? 2. Control-Histamine1mg /ml 2+   ? 3. Peanut Negative   ? 4. Soy bean food Negative   ? 5. Wheat,  whole Negative   ? 6. Sesame Negative   ? 7. Milk, cow Negative   ? 8. Egg white, chicken Negative   ? 9. Casein Negative   ? 10. Cashew Negative   ? 11. Pecan  Negative   ? 12. Walnut Negative   ? 13. Shellfish Negative   ? 14. Shrimp Negative   ? 15. Fish Mix Negative   ? 16. Flounder Negative   ? 17. Pork Negative   ? 18. [place] Meat Negative   ? 19. Beef Negative   ? 20. Lamb Negative   ? 21. Chicken Meat Negative   ? 22. Rice Negative   ? 23. White Potato Negative   ? 24. Tomato Negative   ? 25. Orange Negative   ? 26. Banana Negative   ? 27. Apple Negative   ? 28. Peach Negative   ? 29. Potato, Sweet Negative   ? 30. Pea, Green/English Negative   ? 31. Corn  Negative   ? ?  ?  ? ?  ? ? Airborne Adult Perc - 01/23/22 1426   ? ? Time Antigen Placed 1427   ? Allergen Manufacturer Waynette Buttery   ? Location Back   ? Number of Test 59   ? 1. Control-Buffer 50% Glycerol Negative   ? 2. Control-Histamine 1 mg/ml 2+   ? 3. Albumin saline Negative   ? 4. Bahia Negative   ? 5. [place] --   +/-  ? 6. Johnson Negative   ? 7. Kentucky Blue Negative   ? 8. Meadow Fescue Negative   ? 9. Perennial Rye Negative   ? 10. Sweet Vernal Negative   ? 11. Timothy Negative   ? 12. Cocklebur Negative   ? 13. Burweed Marshelder Negative   ? 14. Ragweed, short Negative   ? 15. Ragweed, Giant Negative   ? 16. Plantain,  English Negative   ? 17. Lamb's Quarters Negative   ? 18. Sheep Sorrell Negative   ? 19. Rough Pigweed Negative   ? 20. Marsh Elder, Rough Negative   ? 21. Mugwort, Common Negative   ? 22. Ash mix Negative   ? 23. Charletta Cousin mix Negative   ? 24. Beech American Negative   ? 25. Box, Elder Negative   ? 26. Cedar, red Negative   ? 27. Cottonwood, [place] Negative   ? 28. Elm mix Negative   ? 29. Hickory Negative   ? 30. Maple mix Negative   ? 31. Oak, [place] mix Negative   ? 32. Pecan Pollen Negative   ? 33. Pine mix Negative   ? 34. Sycamore Eastern Negative   ? 35. Walnut, Black Pollen Negative   ? 36. Alternaria alternata Negative   ?  37. Cladosporium Herbarum Negative   ? 38. Aspergillus mix Negative   ? 39. Penicillium mix Negative   ? 40. Bipolaris sorokiniana (  Helminthosporium) Negative   ? 41. Drechslera spicifera (Curvularia) Negative   ? 42. Mucor plumbeus Negative   ? 43. Fusarium moniliforme Negative   ? 44. Aureobasidium pullulans (pullulara) Negative   ? 45. Rhizopus oryzae Negative   ? 46. Botrytis cinera Negative   ? 47. Epicoccum nigrum Negative   ? 48. Phoma betae Negative   ? 49. Candida Albicans Negative   ? 50. Trichophyton mentagrophytes Negative   ? 51. Mite, D Farinae  5,000 AU/ml Negative   ? 52. Mite, D Pteronyssinus  5,000 AU/ml Negative   ? 53. Cat Hair 10,000 BAU/ml Negative   ? 54.  Dog Epithelia Negative   ? 55. Mixed Feathers Negative   ? 56. Horse Epithelia Negative   ? 57. Cockroach, [place] Negative   ? 58. Mouse Negative   ? 59. Tobacco Leaf Negative   ? ?  ?  ? ?  ? ? ?Past Medical History: ?Patient Active Problem List  ? Diagnosis Date Noted  ? Rash and other nonspecific skin eruption 01/23/2022  ? Pruritus 01/23/2022  ? Other atopic dermatitis 01/23/2022  ? ?Past Medical History:  ?Diagnosis Date  ? Kidney stone   ? ?Past Surgical History: ?History reviewed. No pertinent surgical history. ?Medication List:  ?Current Outpatient Medications  ?Medication Sig Dispense Refill  ? triamcinolone ointment (KENALOG) 0.1 % Apply 1 application. topically 2 (two) times daily as needed (rash flare). Do not use on the face, neck, armpits or groin area. Do not use more than 3 weeks in a row. 30 g 0  ? cetirizine (ZYRTEC) 10 MG tablet 1 tablet (Patient not taking: Reported on 01/23/2022)    ? ?No current facility-administered medications for this visit.  ? ?Allergies: ?No Known Allergies ?Social History: ?Social History  ? ?Socioeconomic History  ? Marital status: Married  ?  Spouse name: Not on file  ? Number of children: Not on file  ? Years of education: Not on file  ? Highest education level: Not on file  ?Occupational  History  ? Not on file  ?Tobacco Use  ? Smoking status: Never  ?  Passive exposure: Never  ? Smokeless tobacco: Not on file  ?Vaping Use  ? Vaping Use: Never used  ?Substance and Sexual Activity  ? Alcohol use:

## 2022-01-23 ENCOUNTER — Other Ambulatory Visit: Payer: Self-pay

## 2022-01-23 ENCOUNTER — Encounter: Payer: Self-pay | Admitting: Allergy

## 2022-01-23 ENCOUNTER — Ambulatory Visit: Payer: 59 | Admitting: Allergy

## 2022-01-23 VITALS — BP 140/88 | HR 100 | Temp 98.5°F | Resp 22 | Ht 67.0 in | Wt 140.0 lb

## 2022-01-23 DIAGNOSIS — R21 Rash and other nonspecific skin eruption: Secondary | ICD-10-CM

## 2022-01-23 DIAGNOSIS — L299 Pruritus, unspecified: Secondary | ICD-10-CM | POA: Diagnosis not present

## 2022-01-23 DIAGNOSIS — L2089 Other atopic dermatitis: Secondary | ICD-10-CM | POA: Diagnosis not present

## 2022-01-23 MED ORDER — TRIAMCINOLONE ACETONIDE 0.1 % EX OINT: 1.0000 "application " | TOPICAL_OINTMENT | Freq: Two times a day (BID) | CUTANEOUS | 0 refills | Status: AC | PRN

## 2022-01-23 NOTE — Patient Instructions (Addendum)
Today's skin testing showed: ?Borderline positive to grass pollen only. No indoor allergens. ?Negative to select foods.  ? ?I don't think the grass caused the itching/rash as grass pollinates in the late spring/summer months.  ? ?Results given. ? ?Rash/itching:  ?Take zyrtec (cetirizine) 10mg  or allegra 180mg  once a day if needed for itching.  ?Avoid the following potential triggers: alcohol, tight clothing, NSAIDs, hot showers and getting overheated. ?Get bloodwork if not better after 1 month.  ?We are ordering labs, so please allow 1-2 weeks for the results to come back. ?With the newly implemented Cures Act, the labs might be visible to you at the same time that they become visible to me. However, I will not address the results until all of the results are back, so please be patient.   ? ?Use triamcinolone 0.1% ointment twice a day as needed for rash flares. Do not use on the face, neck, armpits or groin area. Do not use more than 3 weeks in a row.  ? ?Follow up in 2 months or sooner if needed.   ? ?Skin care recommendations ? ?Bath time: ?Always use lukewarm water. AVOID very hot or cold water. ?Keep bathing time to 5-10 minutes. ?Do NOT use bubble bath. ?Use a mild soap and use just enough to wash the dirty areas. ?Do NOT scrub skin vigorously.  ?After bathing, pat dry your skin with a towel. Do NOT rub or scrub the skin. ? ?Moisturizers and prescriptions:  ?ALWAYS apply moisturizers immediately after bathing (within 3 minutes). This helps to lock-in moisture. ?Use the moisturizer several times a day over the whole body. ?Good summer moisturizers include: Aveeno, CeraVe, Cetaphil. ?Good winter moisturizers include: Aquaphor, Vaseline, Cerave, Cetaphil, Eucerin, Vanicream. ?When using moisturizers along with medications, the moisturizer should be applied about one hour after applying the medication to prevent diluting effect of the medication or moisturize around where you applied the medications. When not using  medications, the moisturizer can be continued twice daily as maintenance. ? ?Laundry and clothing: ?Avoid laundry products with added color or perfumes. ?Use unscented hypo-allergenic laundry products such as Tide free, Cheer free & gentle, and All free and clear.  ?If the skin still seems dry or sensitive, you can try double-rinsing the clothes. ?Avoid tight or scratchy clothing such as wool. ?Do not use fabric softeners or dyer sheets. ? ? ?

## 2022-01-23 NOTE — Assessment & Plan Note (Signed)
Pruritic rash started in November. Pruritis almost resolved but still has some rash. Tried topical permethrin and triamcinolone cream with some benefit. It's unclear if he a steroid injection for this or not. Denies changes in diet, meds or personal care products. Concerned if he developed any allergies. ?? Today's skin prick testing showed: Borderline positive to grass pollen only. No indoor allergens. Negative to select foods.  ?? Discussed with patient that grass pollinates in the late spring/summer months which does not explain his clinical timeline.  ?? Not sure what's caused the rash/itching.   ?? Take zyrtec (cetirizine) 10mg  or allegra 180mg  once a day if needed for itching.  ?? Avoid the following potential triggers: alcohol, tight clothing, NSAIDs, hot showers and getting overheated. ?? Get bloodwork if no improvement after 1 month.  ?

## 2022-01-23 NOTE — Assessment & Plan Note (Signed)
Rash on the antecubital fossa area consistent with eczema. ?? Use triamcinolone 0.1% ointment twice a day as needed for rash flares. Do not use on the face, neck, armpits or groin area. Do not use more than 3 weeks in a row.  ? ?

## 2022-01-23 NOTE — Assessment & Plan Note (Signed)
.   See assessment and plan as above. 

## 2022-03-03 ENCOUNTER — Ambulatory Visit: Payer: Self-pay | Admitting: Allergy

## 2022-04-30 NOTE — Progress Notes (Deleted)
Follow Up Note  RE: Devon Schroeder MRN: [id number] DOB: Jun 17, 1966 Date of Office Visit: 05/01/2022  Referring provider: Clovis Riley, L.August Saucer, MD Primary care provider: Clovis Riley, Elbert Ewings.August Saucer, MD  Chief Complaint: No chief complaint on file.  History of Present Illness: I had the pleasure of seeing Devon Schroeder for a follow up visit at the [facility] on 04/30/2022. He is a 56 y.o. male, who is being followed for rash, pruritus, atopic dermatitis. His previous allergy office visit was on 01/23/2022 with Dr. Selena Batten. Today is a regular follow up visit.  Did you get blood work drawn?  Rash and other nonspecific skin eruption Pruritic rash started in November. Pruritis almost resolved but still has some rash. Tried topical permethrin and triamcinolone cream with some benefit. It's unclear if he a steroid injection for this or not. Denies changes in diet, meds or personal care products. Concerned if he developed any allergies. Today's skin prick testing showed: Borderline positive to grass pollen only. No indoor allergens. Negative to select foods.  Discussed with patient that grass pollinates in the late spring/summer months which does not explain his clinical timeline.  Not sure what's caused the rash/itching.   Take zyrtec (cetirizine) 10mg  or allegra 180mg  once a day if needed for itching.  Avoid the following potential triggers: alcohol, tight clothing, NSAIDs, hot showers and getting overheated. Get bloodwork if no improvement after 1 month.    Pruritus See assessment and plan as above.   Other atopic dermatitis Rash on the antecubital fossa area consistent with eczema. Use triamcinolone 0.1% ointment twice a day as needed for rash flares. Do not use on the face, neck, armpits or groin area. Do not use more than 3 weeks in a row.    Return in about 2 months (around 03/25/2022).  Assessment and Plan: Devon Schroeder is a 56 y.o. male with: No problem-specific Assessment & Plan notes found for  this encounter.  No follow-ups on file.  No orders of the defined types were placed in this encounter.  Lab Orders  No laboratory test(s) ordered today    Diagnostics: Spirometry:  Tracings reviewed. His effort: {Blank single:19197::"Good reproducible efforts.","It was hard to get consistent efforts and there is a question as to whether this reflects a maximal maneuver.","Poor effort, data can not be interpreted."} FVC: ***L FEV1: ***L, ***% predicted FEV1/FVC ratio: ***% Interpretation: {Blank single:19197::"Spirometry consistent with mild obstructive disease","Spirometry consistent with moderate obstructive disease","Spirometry consistent with severe obstructive disease","Spirometry consistent with possible restrictive disease","Spirometry consistent with mixed obstructive and restrictive disease","Spirometry uninterpretable due to technique","Spirometry consistent with normal pattern","No overt abnormalities noted given today's efforts"}.  Please see scanned spirometry results for details.  Skin Testing: {Blank single:19197::"Select foods","Environmental allergy panel","Environmental allergy panel and select foods","Food allergy panel","None","Deferred due to recent antihistamines use"}. *** Results discussed with patient/family.   Medication List:  Current Outpatient Medications  Medication Sig Dispense Refill   cetirizine (ZYRTEC) 10 MG tablet 1 tablet (Patient not taking: Reported on 01/23/2022)     triamcinolone ointment (KENALOG) 0.1 % Apply 1 application. topically 2 (two) times daily as needed (rash flare). Do not use on the face, neck, armpits or groin area. Do not use more than 3 weeks in a row. 30 g 0   No current facility-administered medications for this visit.   Allergies: No Known Allergies I reviewed his past medical history, social history, family history, and environmental history and no significant changes have been reported from his previous visit.  Review of  Systems  Constitutional:  Negative for appetite change, chills, fever and unexpected weight change.  HENT:  Negative for congestion and rhinorrhea.   Eyes:  Negative for itching.  Respiratory:  Negative for cough, chest tightness, shortness of breath and wheezing.   Cardiovascular:  Negative for chest pain.  Gastrointestinal:  Negative for abdominal pain.  Genitourinary:  Negative for difficulty urinating.  Skin:  Positive for rash.  Allergic/Immunologic: Negative for environmental allergies and food allergies.  Neurological:  Negative for headaches.    Objective: There were no vitals taken for this visit. There is no height or weight on file to calculate BMI. Physical Exam Vitals and nursing note reviewed.  Constitutional:      Appearance: Normal appearance. He is well-developed.  HENT:     Head: Normocephalic and atraumatic.     Right Ear: Tympanic membrane and external ear normal.     Left Ear: Tympanic membrane and external ear normal.     Nose: Nose normal.     Mouth/Throat:     Mouth: Mucous membranes are moist.     Pharynx: Oropharynx is clear.  Eyes:     Conjunctiva/sclera: Conjunctivae normal.  Cardiovascular:     Rate and Rhythm: Normal rate and regular rhythm.     Heart sounds: Normal heart sounds. No murmur heard.    No friction rub. No gallop.  Pulmonary:     Effort: Pulmonary effort is normal.     Breath sounds: Normal breath sounds. No wheezing, rhonchi or rales.  Musculoskeletal:     Cervical back: Neck supple.  Skin:    General: Skin is warm.     Findings: Rash present.     Comments: Erythematous patch on left antecubital fossa. Scattered hyperpigmented pinpoint rash on torso.   Neurological:     Mental Status: He is alert and oriented to person, place, and time.  Psychiatric:        Behavior: Behavior normal.    Previous notes and tests were reviewed. The plan was reviewed with the patient/family, and all questions/concerned were addressed.  It  was my pleasure to see Devon Schroeder today and participate in his care. Please feel free to contact me with any questions or concerns.  Sincerely,  Wyline Mood, DO Allergy & Immunology  [facility] office: [phone] [facility] office: [phone]

## 2022-05-01 ENCOUNTER — Ambulatory Visit: Payer: 59 | Admitting: Allergy

## 2022-10-30 ENCOUNTER — Ambulatory Visit: Payer: Commercial Managed Care - HMO | Attending: Family Medicine | Admitting: Physical Therapy

## 2022-10-30 ENCOUNTER — Encounter: Payer: Self-pay | Admitting: Physical Therapy

## 2022-10-30 VITALS — BP 129/85 | HR 76

## 2022-10-30 DIAGNOSIS — R2689 Other abnormalities of gait and mobility: Secondary | ICD-10-CM | POA: Diagnosis present

## 2022-10-30 DIAGNOSIS — R2681 Unsteadiness on feet: Secondary | ICD-10-CM | POA: Insufficient documentation

## 2022-10-30 NOTE — Therapy (Signed)
OUTPATIENT PHYSICAL THERAPY NEURO EVALUATION   Patient Name: Devon Schroeder MRN: [id number] DOB:04-Feb-1966, 56 y.o., male Today's Date: 10/30/2022   PCP: Clovis Riley, L.August Saucer, MD  REFERRING PROVIDER: Irven Coe, MD  END OF SESSION:  PT End of Session - 10/30/22 1239     Visit Number 1    Number of Visits 9    Date for PT Re-Evaluation 11/29/22    Authorization Type [employer]    PT Start Time 1135    PT Stop Time 1222    PT Time Calculation (min) 47 min    Equipment Utilized During Treatment Gait belt    Activity Tolerance Patient tolerated treatment well    Behavior During Therapy WFL for tasks assessed/performed             Past Medical History:  Diagnosis Date   Kidney stone    History reviewed. No pertinent surgical history. Patient Active Problem List   Diagnosis Date Noted   Rash and other nonspecific skin eruption 01/23/2022   Pruritus 01/23/2022   Other atopic dermatitis 01/23/2022    ONSET DATE: 10/28/2022  REFERRING DIAG: R26.89 (ICD-10-CM) - Scissor gait  THERAPY DIAG:  Unsteadiness on feet  Other abnormalities of gait and mobility  Rationale for Evaluation and Treatment: Rehabilitation  SUBJECTIVE:                                                                                                                                                                                             SUBJECTIVE STATEMENT: Having balance issues for a few months. Was in [place] for a few months and saw a doctor there. 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 Pt reports it is nothing serious. Pt's son brought in records from the doctor in [place] (they were in [place] so unable to read them) and MRI scans (with no results). Saw PCP recently and was referred to PT, but not to a neurologist. No falls. No headaches. No dizziness, just feels imbalanced. Can't do heel toe walking. Has to look  down when he is walking. Can't do heel to toe. Feels imbalanced going up and down the stairs, has to hold onto the railing. Notices that he gets off balance at night in the dark when he gets up.  Pt accompanied by: family member - son Sherilyn Cooter (who helps interpret)  PERTINENT HISTORY: PMH: hx of kidney stones, Prediabetes  Per Dr. 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   PAIN:  Are you having pain? No  Vitals:   10/30/22 1154  BP: 129/85  Pulse: 76     PRECAUTIONS: None  WEIGHT BEARING RESTRICTIONS: No  FALLS: Has patient fallen in last 6 months? No  LIVING ENVIRONMENT: Lives with: lives with their family Lives in: House/apartment Stairs: Yes: Internal: 10 steps; on right going up and External: 4 steps; on right going up Reports it is like a split house.  Has following equipment at home: None  PLOF: Independent  PATIENT GOALS: Wants to feel more balanced. More smooth movements of his legs.   OBJECTIVE:   DIAGNOSTIC FINDINGS: Pt had imaging done in [place] (unable to see what was done), per PCP's note, reports that brain had some atrophy.   COGNITION: Overall cognitive status: Language barrier    SENSATION: Light touch: WFL  COORDINATION: Heel to shin: Mildly dysmetric LLE RAM: dysmetric LUE  Finger to nose: Mildly dysmetric LUE    POSTURE: No Significant postural limitations   LOWER EXTREMITY MMT:    MMT Right Eval Left Eval  Hip flexion 5/5 5/5  Hip extension    Hip abduction 5/5 5/5  Hip adduction 5/5 5/5  Hip internal rotation    Hip external rotation    Knee flexion 5/5 4+/5  Knee extension 5/5 4+/5  Ankle dorsiflexion 5/5 5/5  Ankle plantarflexion    Ankle inversion    Ankle eversion    (Blank rows = not tested)   TRANSFERS: Assistive device utilized: None  Sit to stand: SBA Stand to  sit: SBA No UE support with wide BOS    STAIRS: Level of Assistance: SBA Stair Negotiation Technique: Alternating Pattern  with No Rails Number of Stairs: 8  Height of Stairs: 6  Comments: Pt reporting more difficulty with stairs at home as they are higher   GAIT: Gait pattern: step through pattern and wide BOS Distance walked: Clinic distances  Assistive device utilized: None Level of assistance: SBA Comments: No LOB during session  FUNCTIONAL TESTS:  5 times sit to stand: 6.68 seconds with no UE support  10 meter walk test: 8.28 seconds =3.96 ft/sec  SLS: <1 second bilat   M-CTSIB  Condition 1: Firm Surface, EO 30 Sec, Normal Sway  Condition 2: Firm Surface, EC 17 Sec, Mild Sway  Condition 3: Foam Surface, EO 30 Sec, Mild Sway  Condition 4: Foam Surface, EC 2 Sec     [facility] PT Assessment - 10/30/22 1214       Functional Gait  Assessment   Gait assessed  Yes    Gait Level Surface Walks 20 ft in less than 5.5 sec, no assistive devices, good speed, no evidence for imbalance, normal gait pattern, deviates no more than 6 in outside of the 12 in walkway width.   5 seconds   Change in Gait Speed Able to smoothly change walking speed without loss of balance or gait deviation. Deviate no more than 6 in outside of the 12 in walkway width.    Gait with Horizontal Head Turns Performs head turns smoothly with slight change in gait velocity (eg, minor disruption to smooth gait path), deviates 6-10 in outside 12 in walkway width, or uses an assistive device.    Gait with Vertical Head Turns Performs task with slight change in gait velocity (eg, minor disruption to smooth gait path), deviates 6 - 10 in outside 12 in  walkway width or uses assistive device    Gait and Pivot Turn Pivot turns safely within 3 sec and stops quickly with no loss of balance.    Step Over Obstacle Is able to step over 2 stacked shoe boxes taped together (9 in total height) without changing gait speed. No evidence of  imbalance.    Gait with Narrow Base of Support Ambulates less than 4 steps heel to toe or cannot perform without assistance.    Gait with Eyes Closed Cannot walk 20 ft without assistance, severe gait deviations or imbalance, deviates greater than 15 in outside 12 in walkway width or will not attempt task.   pt opening his eyes throughout   Ambulating Backwards Walks 20 ft, slow speed, abnormal gait pattern, evidence for imbalance, deviates 10-15 in outside 12 in walkway width.   18.94 seconds   Steps Alternating feet, no rail.    Total Score 20    FGA comment: 20/30 = Moderate Fall Risk            TODAY'S TREATMENT:                                                                                                                              DATE: N/A during eval.    PATIENT EDUCATION: Education details: Clinical findings, POC, following up with PCP regarding a referral to a neurologist to establish care [place] the [place] and to have MRI scans read (pt's medical notes are from [place] and has MRI scans, but no clear results), use of night lights for improved balance as pt most challenged when vision is removed.  Person educated: Patient and Child(ren) Education method: Explanation Education comprehension: verbalized understanding  HOME EXERCISE PROGRAM: Will provide at next session.   GOALS: Goals reviewed with patient? Yes  SHORT TERM GOALS:  ALL STGS = LTGS  LONG TERM GOALS: Target date: 11/27/2022  Pt will be independent with final HEP for balance deficits in order to build upon functional gains made in therapy. Baseline:  Goal status: INITIAL  2.  Pt will improve FGA to at least a 25/30 in order to demo decr fall risk. Baseline: 20/30 Goal status: INITIAL  3.  Pt will improve condition 2 of mCTSIB to 30 seconds for improved balance with EC.  Baseline: 17 seconds  Goal status: INITIAL  4.  Pt will improve condition 4 of mCTSIB to 12 seconds for improved vestibular input for  balance.  Baseline: 2 seconds  Goal status: INITIAL   ASSESSMENT:  CLINICAL IMPRESSION: Patient is a 56 year old male referred to Neuro OPPT for imbalance.   Pt's PMH is significant for: Prediabetes. Pt was in [place] for a few months where balance deficits started, and per the doctor he saw in [place] he has some brain atrophy. Pt brought in MRI scans and medical records (in [place]). Discussed would benefit from getting a referral from his PCP to a neurologist here to have  scans read and establish care.  The following deficits were present during the exam: impaired coordination, impaired balance, gait abnormalities, decr strength. Based on FGA, pt is a medium risk for falls. Pt unable to hold SLS each side for >1 second. Based on mCTSIB pt more reliant on vision for balance and pt with significantly decr vestibular input for balance.  Pt would benefit from skilled PT to address these impairments and functional limitations to maximize functional mobility independence and decr fall risk.    OBJECTIVE IMPAIRMENTS: Abnormal gait, decreased balance, decreased coordination, difficulty walking, and decreased strength.   ACTIVITY LIMITATIONS: stairs and locomotion level  PARTICIPATION LIMITATIONS: community activity  PERSONAL FACTORS: Age, Past/current experiences, Time since onset of injury/illness/exacerbation, 1 comorbidity: Prediabetes, and Language barrier  are also affecting patient's functional outcome.   REHAB POTENTIAL: Good  CLINICAL DECISION MAKING: Stable/uncomplicated  EVALUATION COMPLEXITY: Low  PLAN:  PT FREQUENCY: 2x/week  PT DURATION: 4 weeks  PLANNED INTERVENTIONS: Therapeutic exercises, Therapeutic activity, Neuromuscular re-education, Balance training, Gait training, Patient/Family education, Self Care, Stair training, Vestibular training, and Re-evaluation  PLAN FOR NEXT SESSION: Initial HEP - tandem, EC, unlevel surfaces, SLS, head motions. Work on narrow BOS tasks.  Continue working on areas above in therapy session for improved balance    Drake Leach, PT, DPT  10/30/2022, 12:40 PM

## 2022-11-04 ENCOUNTER — Encounter: Payer: Self-pay | Admitting: Physical Therapy

## 2022-11-04 ENCOUNTER — Ambulatory Visit: Payer: Commercial Managed Care - HMO | Admitting: Physical Therapy

## 2022-11-04 VITALS — BP 142/89 | HR 86

## 2022-11-04 DIAGNOSIS — R2681 Unsteadiness on feet: Secondary | ICD-10-CM

## 2022-11-04 DIAGNOSIS — R2689 Other abnormalities of gait and mobility: Secondary | ICD-10-CM

## 2022-11-04 NOTE — Therapy (Signed)
OUTPATIENT PHYSICAL THERAPY NEURO EVALUATION   Patient Name: Devon Schroeder MRN: [id number] DOB:09/21/1966, 56 y.o., male Today's Date: 11/04/2022   PCP: Clovis Riley, L.August Saucer, MD  REFERRING PROVIDER: Irven Coe, MD  END OF SESSION:  PT End of Session - 11/04/22 1318     Visit Number 2    Number of Visits 9    Date for PT Re-Evaluation 11/29/22    Authorization Type [employer]    PT Start Time 1317    PT Stop Time 1355    PT Time Calculation (min) 38 min    Equipment Utilized During Treatment Gait belt    Activity Tolerance Patient tolerated treatment well    Behavior During Therapy WFL for tasks assessed/performed             Past Medical History:  Diagnosis Date   Kidney stone    History reviewed. No pertinent surgical history. Patient Active Problem List   Diagnosis Date Noted   Rash and other nonspecific skin eruption 01/23/2022   Pruritus 01/23/2022   Other atopic dermatitis 01/23/2022    ONSET DATE: 10/28/2022  REFERRING DIAG: R26.89 (ICD-10-CM) - Scissor gait  THERAPY DIAG:  Unsteadiness on feet  Other abnormalities of gait and mobility  Rationale for Evaluation and Treatment: Rehabilitation  SUBJECTIVE:                                                                                                                                                                                             SUBJECTIVE STATEMENT: Patient reports that he is doing well. He denies falls and anything significantly new since last session.  Pt accompanied by: self and patient refused interpretor when asked if he would like one    PERTINENT HISTORY: PMH: hx of kidney stones, Prediabetes  Per Dr. 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   PAIN:  Are you having pain? No  Vitals:   11/04/22 1326  BP: (!) 142/89  Pulse: 86      PRECAUTIONS: None  WEIGHT BEARING RESTRICTIONS: No  FALLS: Has patient fallen in last 6 months? No  PATIENT GOALS: Wants to feel more balanced. More smooth movements of his legs.   OBJECTIVE:   TODAY'S TREATMENT:  NMR (Created Initial HEP) **Performed as Corner balance with chair in front to act as support with SBA  - Standing Tandem Balance with Counter Support: 3 sets - 30" hold - Standing March with Counter Support  - 3 sets - 10 reps - Single Leg Stance with Support  - 3 sets - 10" hold - Corner Balance Feet Together: Eyes Open With Head Turns  - 3 sets - 10 reps - Corner Balance Feet Together With Eyes Closed  -  3 sets - 30" hold  Not on HEP - Corner balance EC on pillows 3 x 12-20" trials - Corner balance EO on 2 pillows 3 X 30" - Step over obstacle course 6 x 6" hurdles x 10' (SBA-CGA) - Step over obstacle course 3 x 6" hurdles x 10' + 12" hurdle taps x 4 (SBA-CGA)   PATIENT EDUCATION: 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 Person educated: Patient Education method: Explanation and Handouts Education comprehension: verbalized understanding  HOME EXERCISE PROGRAM:  Access Code: [email or web address] URL: [email or web address] Date: 11/04/2022 Prepared by: Maryruth Eve  Exercises - Standing Tandem Balance with Counter Support  - 1 x daily - 7 x weekly - 3 sets - 30" hold - Standing March with Counter Support  - 1 x daily - 7 x weekly - 3 sets - 10 reps - Single Leg Stance with Support  - 1 x daily - 7 x weekly - 3 sets - 10" hold - Corner Balance Feet Together: Eyes Open With Head Turns  - 1 x daily - 7 x weekly - 3 sets - 10 reps - Corner Balance Feet Together With Eyes Closed  - 1 x daily - 7 x weekly - 3 sets - 30" hold  GOALS: Goals reviewed with patient? Yes  SHORT TERM GOALS:  ALL STGS = LTGS  LONG TERM GOALS: Target  date: 11/27/2022  Pt will be independent with final HEP for balance deficits in order to build upon functional gains made in therapy. Baseline:  Goal status: INITIAL  2.  Pt will improve FGA to at least a 25/30 in order to demo decr fall risk. Baseline: 20/30 Goal status: INITIAL  3.  Pt will improve condition 2 of mCTSIB to 30 seconds for improved balance with EC.  Baseline: 17 seconds  Goal status: INITIAL  4.  Pt will improve condition 4 of mCTSIB to 12 seconds for improved vestibular input for balance.  Baseline: 2 seconds  Goal status: INITIAL   ASSESSMENT:  CLINICAL IMPRESSION: Session emphasized creation of initial HEP in order for patient to progress between sessions and ended session with modified SLS activities. Patient demonstrated good understanding of initial HEP and verbalized understanding of proper safety measures including performing in corner with chair in front and with supervision of safety member. Patient demonstrates greatest difficulty with SLS activities and had x3 hurdle knock overs during modified SLS activities at end of session. Improved with repetition. Pt would benefit from skilled PT to address these impairments and functional limitations to maximize functional mobility independence and decr fall risk. Continue POC.    OBJECTIVE IMPAIRMENTS: Abnormal gait, decreased balance, decreased coordination, difficulty walking, and decreased strength.   ACTIVITY LIMITATIONS: stairs and locomotion level  PARTICIPATION LIMITATIONS: community activity  PERSONAL FACTORS: Age, Past/current experiences, Time since onset of injury/illness/exacerbation, 1 comorbidity: Prediabetes, and Language barrier  are also affecting patient's functional outcome.   REHAB POTENTIAL: Good  CLINICAL DECISION MAKING: Stable/uncomplicated  EVALUATION COMPLEXITY: Low  PLAN:  PT FREQUENCY: 2x/week  PT DURATION: 4 weeks  PLANNED INTERVENTIONS: Therapeutic exercises, Therapeutic  activity, Neuromuscular re-education, Balance training, Gait training, Patient/Family education, Self Care, Stair training, Vestibular training, and Re-evaluation  PLAN FOR NEXT SESSION: Initial HEP - tandem, EC, unlevel surfaces, SLS, head motions. Work on narrow BOS tasks. Continue working on areas above in therapy session for improved balance; modified SLS tasks and higher level obstacle courses  Maryruth Eve, PT, DPT  11/04/2022, 2:23 PM

## 2022-11-06 ENCOUNTER — Ambulatory Visit: Payer: Commercial Managed Care - HMO | Admitting: Physical Therapy

## 2022-11-07 ENCOUNTER — Encounter: Payer: Self-pay | Admitting: Physical Therapy

## 2022-11-07 ENCOUNTER — Ambulatory Visit: Payer: Commercial Managed Care - HMO | Admitting: Physical Therapy

## 2022-11-07 VITALS — BP 138/84 | HR 82

## 2022-11-07 DIAGNOSIS — R2681 Unsteadiness on feet: Secondary | ICD-10-CM | POA: Diagnosis not present

## 2022-11-07 NOTE — Therapy (Signed)
OUTPATIENT PHYSICAL THERAPY NEURO EVALUATION   Patient Name: Devon Schroeder MRN: [id number] DOB:[birth year], 56 y.o., male Today's Date: 11/07/2022   PCP: Clovis Riley, L.August Saucer, MD  REFERRING PROVIDER: Irven Coe, MD  END OF SESSION:  PT End of Session - 11/07/22 1234     Visit Number 3    Number of Visits 9    Date for PT Re-Evaluation 11/29/22    Authorization Type [employer]    PT Start Time 1234    PT Stop Time 1315    PT Time Calculation (min) 41 min    Equipment Utilized During Treatment Gait belt    Activity Tolerance Patient tolerated treatment well    Behavior During Therapy WFL for tasks assessed/performed             Past Medical History:  Diagnosis Date   Kidney stone    History reviewed. No pertinent surgical history. Patient Active Problem List   Diagnosis Date Noted   Rash and other nonspecific skin eruption 01/23/2022   Pruritus 01/23/2022   Other atopic dermatitis 01/23/2022    ONSET DATE: 10/28/2022  REFERRING DIAG: R26.89 (ICD-10-CM) - Scissor gait  THERAPY DIAG:  Unsteadiness on feet  Rationale for Evaluation and Treatment: Rehabilitation  SUBJECTIVE:                                                                                                                                                                                             SUBJECTIVE STATEMENT: Patient reports that he is doing well. He states he has had no problems with his exercises and performing them in the corner. He denies falls since last visit. Patient states his wife is present when performing exercises.  Pt accompanied by: self and patient refused interpretor when asked if he would like one    PERTINENT HISTORY: PMH: hx of kidney stones, Prediabetes  Per Dr. 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   PAIN:  Are you  having pain? No  Vitals:   11/07/22 1239  BP: 138/84  Pulse: 82      PRECAUTIONS: None  WEIGHT BEARING RESTRICTIONS: No  FALLS: Has patient fallen in last 6 months? No  PATIENT GOALS: Wants to feel more balanced. More smooth movements of his legs.   OBJECTIVE:   TODAY'S TREATMENT:  Vitals:   11/07/22 1239  BP: 138/84  Pulse: 82     NMR (Created Initial HEP) **Performed as Corner balance with chair in front to act as support with SBA  - Single leg balance 5 x 3-6" trials  - Foam balance EO 3 x 20-30" with minA x 2 to correct balance   - EC on foam 3 x 5-15" trials with minA x 3 to correct balance  - EO on foam in semitandem 2 x 30" bilaterally  Blaze pods (5 pods) Progressions (CGA-SBA) Reactive stepping on solid surface all directions x 60" Lateral reactive stepping on solid surface x 60" Reactive stepping on compliant surfaces in all directions x 60 Reactive stepping on variable compliant surface + step taps 3 x 60"  Modified SLS progressions:  Trialed ball rolls but patient unable to complete (CGA)  6" hurdle taps 2 x 10 bil alt (CGA)  6" hurdle modified SLS holds 2 x 10" (CGA)  PATIENT EDUCATION: Education details: Continue HEP, SLS progression Person educated: Patient Education method: Explanation and Handouts Education comprehension: verbalized understanding  HOME EXERCISE PROGRAM:  Access Code: [email or web address] URL: [email or web address] Date: 11/04/2022 Prepared by: Maryruth Eve  Exercises - Standing Tandem Balance with Counter Support  - 1 x daily - 7 x weekly - 3 sets - 30" hold - Standing March with Counter Support  - 1 x daily - 7 x weekly - 3 sets - 10 reps - Single Leg Stance with Support  - 1 x daily - 7 x weekly - 3 sets - 10" hold - Corner Balance Feet Together: Eyes Open With Head Turns  - 1 x daily - 7 x  weekly - 3 sets - 10 reps - Corner Balance Feet Together With Eyes Closed  - 1 x daily - 7 x weekly - 3 sets - 30" hold  GOALS: Goals reviewed with patient? Yes  SHORT TERM GOALS:  ALL STGS = LTGS  LONG TERM GOALS: Target date: 11/27/2022  Pt will be independent with final HEP for balance deficits in order to build upon functional gains made in therapy. Baseline:  Goal status: INITIAL  2.  Pt will improve FGA to at least a 25/30 in order to demo decr fall risk. Baseline: 20/30 Goal status: INITIAL  3.  Pt will improve condition 2 of mCTSIB to 30 seconds for improved balance with EC.  Baseline: 17 seconds  Goal status: INITIAL  4.  Pt will improve condition 4 of mCTSIB to 12 seconds for improved vestibular input for balance.  Baseline: 2 seconds  Goal status: INITIAL   ASSESSMENT:  CLINICAL IMPRESSION: Session emphasized progression of both dynamic and static balance. Patient strongly reliant on vision for balance and demonstrates moderate increase in sway and requires intermittent assist with EC balance. SLS continues to be limited to <10". Patient tolerated modified progression of hurdle taps well. Pt would benefit from skilled PT to address these impairments and functional limitations to maximize functional mobility independence and decr fall risk. Continue POC.    OBJECTIVE IMPAIRMENTS: Abnormal gait, decreased balance, decreased coordination, difficulty walking, and decreased strength.   ACTIVITY LIMITATIONS: stairs and locomotion level  PARTICIPATION LIMITATIONS: community activity  PERSONAL FACTORS: Age, Past/current experiences, Time since onset of injury/illness/exacerbation, 1 comorbidity: Prediabetes, and Language barrier  are also affecting patient's functional outcome.   REHAB POTENTIAL: Good  CLINICAL DECISION MAKING: Stable/uncomplicated  EVALUATION COMPLEXITY: Low  PLAN:  PT FREQUENCY: 2x/week  PT DURATION: 4 weeks  PLANNED INTERVENTIONS: Therapeutic  exercises, Therapeutic activity, Neuromuscular re-education, Balance training, Gait training, Patient/Family education, Self Care, Stair training, Vestibular training, and Re-evaluation  PLAN FOR NEXT SESSION: Initial HEP - tandem, EC, unlevel surfaces, SLS, head motions. Work on narrow BOS tasks. Continue working on areas above in therapy session for improved balance; modified SLS tasks and higher level obstacle courses; treadmill, fall recovery  Maryruth Eve, PT, DPT  11/07/2022, 1:45 PM

## 2022-11-11 ENCOUNTER — Ambulatory Visit: Payer: 59 | Attending: Family Medicine | Admitting: Physical Therapy

## 2022-11-11 ENCOUNTER — Encounter: Payer: Self-pay | Admitting: Physical Therapy

## 2022-11-11 DIAGNOSIS — R2689 Other abnormalities of gait and mobility: Secondary | ICD-10-CM | POA: Insufficient documentation

## 2022-11-11 DIAGNOSIS — B360 Pityriasis versicolor: Secondary | ICD-10-CM | POA: Diagnosis not present

## 2022-11-11 DIAGNOSIS — R2681 Unsteadiness on feet: Secondary | ICD-10-CM | POA: Insufficient documentation

## 2022-11-11 NOTE — Therapy (Signed)
OUTPATIENT PHYSICAL THERAPY NEURO TREATMENT   Patient Name: Devon Schroeder MRN: [id number] DOB:[birth year], 57 y.o., male Today's Date: 11/11/2022   PCP: Clovis Riley, L.August Saucer, MD  REFERRING PROVIDER: Irven Coe, MD  END OF SESSION:  PT End of Session - 11/11/22 1532     Visit Number 4    Number of Visits 9    Date for PT Re-Evaluation 11/29/22    Authorization Type [employer]    PT Start Time 1530    PT Stop Time 1613    PT Time Calculation (min) 43 min    Equipment Utilized During Treatment Gait belt    Activity Tolerance Patient tolerated treatment well    Behavior During Therapy WFL for tasks assessed/performed             Past Medical History:  Diagnosis Date   Kidney stone    History reviewed. No pertinent surgical history. Patient Active Problem List   Diagnosis Date Noted   Rash and other nonspecific skin eruption 01/23/2022   Pruritus 01/23/2022   Other atopic dermatitis 01/23/2022    ONSET DATE: 10/28/2022  REFERRING DIAG: R26.89 (ICD-10-CM) - Scissor gait  THERAPY DIAG:  Unsteadiness on feet  Other abnormalities of gait and mobility  Rationale for Evaluation and Treatment: Rehabilitation  SUBJECTIVE:                                                                                                                                                                                             SUBJECTIVE STATEMENT: Patient reports that he is doing well. He continues to do his exercises at home and reports that his exercises are going well but single leg balance continues to be most challenging.   Pt accompanied by: self and patient refused interpretor when asked if he would like one  wife - Amy  PERTINENT HISTORY: PMH: hx of kidney stones, Prediabetes  Per Dr. 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   PAIN:   Are you having pain? No  There were no vitals filed for this visit.   PRECAUTIONS: None  WEIGHT BEARING RESTRICTIONS: No  FALLS: Has patient fallen in last 6 months? No  PATIENT GOALS: Wants to feel more balanced. More smooth movements of his legs.   OBJECTIVE:   TODAY'S TREATMENT:  There were no vitals filed for this visit.  NMR:  [facility] PT Assessment - 11/11/22 0001       Functional Gait  Assessment   Gait Level Surface Walks 20 ft in less than 5.5 sec, no assistive devices, good speed, no evidence for imbalance, normal gait pattern, deviates no more than 6 in outside of the 12 in walkway width.    Change in Gait Speed Able to smoothly change walking speed without loss of balance or gait deviation. Deviate no more than 6 in outside of the 12 in walkway width.    Gait with Horizontal Head Turns Performs head turns smoothly with slight change in gait velocity (eg, minor disruption to smooth gait path), deviates 6-10 in outside 12 in walkway width, or uses an assistive device.    Gait with Vertical Head Turns Performs head turns with no change in gait. Deviates no more than 6 in outside 12 in walkway width.    Gait and Pivot Turn Pivot turns safely within 3 sec and stops quickly with no loss of balance.    Step Over Obstacle Is able to step over 2 stacked shoe boxes taped together (9 in total height) without changing gait speed. No evidence of imbalance.    Gait with Narrow Base of Support Ambulates less than 4 steps heel to toe or cannot perform without assistance.    Gait with Eyes Closed Walks 20 ft, uses assistive device, slower speed, mild gait deviations, deviates 6-10 in outside 12 in walkway width. Ambulates 20 ft in less than 9 sec but greater than 7 sec.    Ambulating Backwards Walks 20 ft, uses assistive device, slower speed, mild gait deviations,  deviates 6-10 in outside 12 in walkway width.    Steps Alternating feet, no rail.    Total Score 24              [facility] PT Assessment - 11/11/22 0001       Functional Gait  Assessment   Gait Level Surface Walks 20 ft in less than 5.5 sec, no assistive devices, good speed, no evidence for imbalance, normal gait pattern, deviates no more than 6 in outside of the 12 in walkway width.    Change in Gait Speed Able to smoothly change walking speed without loss of balance or gait deviation. Deviate no more than 6 in outside of the 12 in walkway width.    Gait with Horizontal Head Turns Performs head turns smoothly with slight change in gait velocity (eg, minor disruption to smooth gait path), deviates 6-10 in outside 12 in walkway width, or uses an assistive device.    Gait with Vertical Head Turns Performs head turns with no change in gait. Deviates no more than 6 in outside 12 in walkway width.    Gait and Pivot Turn Pivot turns safely within 3 sec and stops quickly with no loss of balance.    Step Over Obstacle Is able to step over 2 stacked shoe boxes taped together (9 in total height) without changing gait speed. No evidence of imbalance.    Gait with Narrow Base of Support Ambulates less than 4 steps heel to toe or cannot perform without assistance.    Gait with Eyes Closed Walks 20 ft, uses assistive device, slower speed, mild gait deviations, deviates 6-10 in outside 12 in walkway width. Ambulates 20 ft in less than 9 sec but greater than 7 sec.    Ambulating Backwards Walks 20 ft, uses assistive device,  slower speed, mild gait deviations, deviates 6-10 in outside 12 in walkway width.    Steps Alternating feet, no rail.    Total Score 24            **Performed as Corner balance with chair in front to act as support with SBA - Single leg balance 6 x 4-8" trials added two finger assist and progressed to 10" on final 2 trials  - Foam balance EO 3 x 30" with CGA   - EC on foam 3 x 20-30"  trials with minA x 2 to correct balance  - EC on firm surface with head turns 3 x 30" horizontal/veritcal  Floor transfer with UE support - SBA with min verbal cues  Foam step ups on red large mat onto airex and off 2 x 10 (SBA) Lateral step up / off foam on red mat x 10 (SBA)  PATIENT EDUCATION: Education details: HEP updates and safety with airex and balance exercises at home; perform with supervision/family present Person educated: Patient Education method: Explanation and Handouts Education comprehension: verbalized understanding  HOME EXERCISE PROGRAM:  Access Code: [email or web address] URL: [email or web address] Date: 11/11/2022 Prepared by: Malachi Carl  Exercises - Standing Tandem Balance with Counter Support  - 1 x daily - 7 x weekly - 3 sets - 30" hold - Standing March with Counter Support  - 1 x daily - 7 x weekly - 3 sets - 10 reps - Single Leg Stance with Support  - 1 x daily - 7 x weekly - 3 sets - 10" hold - Corner Balance Feet Together: Eyes Open With Head Turns  - 1 x daily - 7 x weekly - 3 sets - 10 reps - Corner Balance Feet Together With Eyes Closed  - 1 x daily - 7 x weekly - 3 sets - 30" hold - Corner Balance Feet Together: Eyes Closed With Head Turns  - 1 x daily - 7 x weekly - 3 sets - 10 reps - Romberg Stance Eyes Closed on Foam Pad  - 1 x daily - 7 x weekly - 3 sets - 30 hold  GOALS: Goals reviewed with patient? Yes  SHORT TERM GOALS:  ALL STGS = LTGS  LONG TERM GOALS: Target date: 11/27/2022  Pt will be independent with final HEP for balance deficits in order to build upon functional gains made in therapy. Baseline:  Goal status: INITIAL  2.  Pt will improve FGA to at least a 25/30 in order to demo decr fall risk. Baseline: 20/30; 24/30 on 11/11/2022 Goal status: IN PROGRESS  3.  Pt will improve condition 2 of mCTSIB to 30 seconds for improved balance with EC.  Baseline: 17 seconds  Goal status: INITIAL  4.  Pt will improve condition 4 of  mCTSIB to 12 seconds for improved vestibular input for balance.  Baseline: 2 seconds  Goal status: INITIAL   ASSESSMENT:  CLINICAL IMPRESSION: Session emphasized progression of HEP, fall recovery, and assessment of progress towards FGA. Patient demonstrated improved FGA score to 24/30 demonstrating reduced risk of falls and good carryover from previous sessions. Patient continues to demonstrate full compliance with HEP and demonstrates improved EC balance and slightly longer tolerance for single leg balance. Updated HEP. Pt would benefit from skilled PT to address these impairments and functional limitations to maximize functional mobility independence and decr fall risk. Continue POC.    OBJECTIVE IMPAIRMENTS: Abnormal gait, decreased balance, decreased coordination, difficulty walking, and decreased strength.   ACTIVITY LIMITATIONS: stairs and  locomotion level  PARTICIPATION LIMITATIONS: community activity  PERSONAL FACTORS: Age, Past/current experiences, Time since onset of injury/illness/exacerbation, 1 comorbidity: Prediabetes, and Language barrier  are also affecting patient's functional outcome.   REHAB POTENTIAL: Good  CLINICAL DECISION MAKING: Stable/uncomplicated  EVALUATION COMPLEXITY: Low  PLAN:  PT FREQUENCY: 2x/week  PT DURATION: 4 weeks  PLANNED INTERVENTIONS: Therapeutic exercises, Therapeutic activity, Neuromuscular re-education, Balance training, Gait training, Patient/Family education, Self Care, Stair training, Vestibular training, and Re-evaluation  PLAN FOR NEXT SESSION: Initial HEP - tandem, EC, unlevel surfaces, SLS, head motions. Work on narrow BOS tasks. Continue working on areas above in therapy session for improved balance; modified SLS tasks and higher level obstacle courses; treadmill, fall recovery  Malachi Carl, PT, DPT  11/11/2022, 4:37 PM

## 2022-11-13 ENCOUNTER — Encounter: Payer: Self-pay | Admitting: Physical Therapy

## 2022-11-13 ENCOUNTER — Ambulatory Visit: Payer: 59 | Admitting: Physical Therapy

## 2022-11-13 VITALS — BP 131/85 | HR 82

## 2022-11-13 DIAGNOSIS — R2681 Unsteadiness on feet: Secondary | ICD-10-CM | POA: Diagnosis not present

## 2022-11-13 DIAGNOSIS — R2689 Other abnormalities of gait and mobility: Secondary | ICD-10-CM | POA: Diagnosis not present

## 2022-11-13 NOTE — Therapy (Signed)
OUTPATIENT PHYSICAL THERAPY NEURO TREATMENT   Patient Name: Devon Schroeder MRN: [id number] DOB:[birth year], 57 y.o., male Today's Date: 11/13/2022   PCP: Clovis Riley, L.August Saucer, MD  REFERRING PROVIDER: Irven Coe, MD  END OF SESSION:  PT End of Session - 11/13/22 1225     Visit Number 5    Number of Visits 9    Date for PT Re-Evaluation 11/29/22    Authorization Type [employer]    PT Start Time 1225    PT Stop Time 1310    PT Time Calculation (min) 45 min    Equipment Utilized During Treatment Gait belt    Activity Tolerance Patient tolerated treatment well    Behavior During Therapy WFL for tasks assessed/performed             Past Medical History:  Diagnosis Date   Kidney stone    History reviewed. No pertinent surgical history. Patient Active Problem List   Diagnosis Date Noted   Rash and other nonspecific skin eruption 01/23/2022   Pruritus 01/23/2022   Other atopic dermatitis 01/23/2022    ONSET DATE: 10/28/2022  REFERRING DIAG: R26.89 (ICD-10-CM) - Scissor gait  THERAPY DIAG:  Unsteadiness on feet  Other abnormalities of gait and mobility  Rationale for Evaluation and Treatment: Rehabilitation  SUBJECTIVE:                                                                                                                                                                                             SUBJECTIVE STATEMENT: Patient reports that he is doing well and is walking about 30-35 minutes a day. Continues to have most difficulty with single leg balance per report. Denies falls and changes to medications. Patient also reports that he got an airex to do exercises at home and is doing so when wife is present.   Pt accompanied by: self and patient refused interpretor when asked if he would like one    PERTINENT HISTORY: PMH: hx of kidney stones, Prediabetes  Per Dr. 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   PAIN:  Are you having pain? No  Vitals:   11/13/22 1230  BP: 131/85  Pulse: 82     PRECAUTIONS: None  WEIGHT BEARING RESTRICTIONS: No  FALLS: Has patient fallen in last 6 months? No  PATIENT GOALS: Wants to feel more balanced. More smooth movements of his legs.   OBJECTIVE:   TODAY'S TREATMENT:  Vitals:   11/13/22 1230  BP: 131/85  Pulse: 82   NMR:  Tandem walking overground progression (4x10' UE assist grip, 4x10' UE light hold, 6x10' no UE assist) (SBA-CGA)  Grape vine walking 8 x 10' (CGA-SBA)  Balance beam walking 4 x fwd, 4 x laterally (CGA)  10# medicine ball slams for balance with pertubations 3 x 10 (SBA)  10# med ball rolls for modified SLS 3 x 20 rolls bilaterally without UE support (SBA)  SLS modified 3 x 2-10" trials in corner with chair in front for safety; patient was able to complete 10" holds bilaterally x 1 trial with timer in front for motivation (SBA)   PATIENT EDUCATION: Education details: reinforced HEP and safety with airex and balance exercises at home; perform with supervision/family present Person educated: Patient Education method: Explanation and Handouts Education comprehension: verbalized understanding  HOME EXERCISE PROGRAM:  Access Code: [email or web address] URL: [email or web address] Date: 11/11/2022 Prepared by: Malachi Carl  Exercises - Standing Tandem Balance with Counter Support  - 1 x daily - 7 x weekly - 3 sets - 30" hold - Standing March with Counter Support  - 1 x daily - 7 x weekly - 3 sets - 10 reps - Single Leg Stance with Support  - 1 x daily - 7 x weekly - 3 sets - 10" hold - Corner Balance Feet Together: Eyes Open With Head Turns  - 1 x daily - 7 x weekly - 3 sets - 10 reps - Corner Balance  Feet Together With Eyes Closed  - 1 x daily - 7 x weekly - 3 sets - 30" hold - Corner Balance Feet Together: Eyes Closed With Head Turns  - 1 x daily - 7 x weekly - 3 sets - 10 reps - Romberg Stance Eyes Closed on Foam Pad  - 1 x daily - 7 x weekly - 3 sets - 30 hold  GOALS: Goals reviewed with patient? Yes  SHORT TERM GOALS:  ALL STGS = LTGS  LONG TERM GOALS: Target date: 11/27/2022  Pt will be independent with final HEP for balance deficits in order to build upon functional gains made in therapy. Baseline:  Goal status: INITIAL  2.  Pt will improve FGA to at least a 25/30 in order to demo decr fall risk. Baseline: 20/30; 24/30 on 11/11/2022 Goal status: IN PROGRESS  3.  Pt will improve condition 2 of mCTSIB to 30 seconds for improved balance with EC.  Baseline: 17 seconds  Goal status: INITIAL  4.  Pt will improve condition 4 of mCTSIB to 12 seconds for improved vestibular input for balance.  Baseline: 2 seconds  Goal status: INITIAL   ASSESSMENT:  CLINICAL IMPRESSION: Session emphasized dynamic balance tasks and modified single leg stability. Patient demonstrated improved tandem walking in today's session and was able to complete 4 consecutive steps at best. Patient was also able to achieve x 10" holds bilaterally for the first time during SLS but will require reinforcement for consistency. Pt would benefit from skilled PT to address these impairments and functional limitations to maximize functional mobility independence and decr fall risk. Continue POC.    OBJECTIVE IMPAIRMENTS: Abnormal gait, decreased balance, decreased coordination, difficulty walking, and decreased strength.   ACTIVITY LIMITATIONS: stairs and locomotion level  PARTICIPATION LIMITATIONS: community activity  PERSONAL FACTORS: Age, Past/current experiences, Time since onset of injury/illness/exacerbation, 1 comorbidity: Prediabetes, and Language barrier  are also affecting patient's functional outcome.    REHAB POTENTIAL: Good  CLINICAL DECISION MAKING: Stable/uncomplicated  EVALUATION COMPLEXITY: Low  PLAN:  PT FREQUENCY: 2x/week  PT DURATION: 4 weeks  PLANNED INTERVENTIONS: Therapeutic exercises, Therapeutic activity, Neuromuscular re-education, Balance training, Gait training, Patient/Family education, Self Care, Stair training, Vestibular training, and Re-evaluation  PLAN FOR NEXT SESSION: Initial HEP - tandem, EC, unlevel surfaces, SLS, head motions. Work on narrow BOS tasks. Continue working on areas above in therapy session for improved balance; modified SLS tasks and higher level obstacle courses; treadmill, fall recovery  Malachi Carl, PT, DPT  11/13/2022, 2:23 PM

## 2022-11-17 ENCOUNTER — Encounter: Payer: Self-pay | Admitting: Neurology

## 2022-11-18 ENCOUNTER — Encounter: Payer: Self-pay | Admitting: Physical Therapy

## 2022-11-18 ENCOUNTER — Ambulatory Visit: Payer: 59 | Admitting: Physical Therapy

## 2022-11-18 VITALS — BP 121/80 | HR 85

## 2022-11-18 DIAGNOSIS — R2689 Other abnormalities of gait and mobility: Secondary | ICD-10-CM | POA: Diagnosis not present

## 2022-11-18 DIAGNOSIS — R2681 Unsteadiness on feet: Secondary | ICD-10-CM

## 2022-11-18 NOTE — Therapy (Signed)
OUTPATIENT PHYSICAL THERAPY NEURO TREATMENT   Patient Name: Devon Schroeder MRN: [id number] DOB:07-Feb-1966, 57 y.o., male Today's Date: 11/18/2022  PCP: Alroy Dust, L.Marlou Sa, MD REFERRING PROVIDER: Collene Leyden, MD  END OF SESSION:  PT End of Session - 11/18/22 1017     Visit Number 6    Number of Visits 9    Date for PT Re-Evaluation 11/29/22    Authorization Type [employer]    PT Start Time 1016    PT Stop Time 1057    PT Time Calculation (min) 41 min    Equipment Utilized During Treatment Gait belt    Activity Tolerance Patient tolerated treatment well    Behavior During Therapy WFL for tasks assessed/performed             Past Medical History:  Diagnosis Date   Kidney stone    History reviewed. No pertinent surgical history. Patient Active Problem List   Diagnosis Date Noted   Rash and other nonspecific skin eruption 01/23/2022   Pruritus 01/23/2022   Other atopic dermatitis 01/23/2022    ONSET DATE: 10/28/2022  REFERRING DIAG: R26.89 (ICD-10-CM) - Scissor gait  THERAPY DIAG:  Unsteadiness on feet  Other abnormalities of gait and mobility  Rationale for Evaluation and Treatment: Rehabilitation  SUBJECTIVE:                                                                                                                                                                                             SUBJECTIVE STATEMENT: Patient reports no falls. He has been practicing tandem balance in hallway with close walls on either side. Her  Pt accompanied by: self and patient refused interpretor when asked if he would like one    PERTINENT HISTORY: PMH: hx of kidney stones, Prediabetes  Per Dr. 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   PAIN:  Are you having pain? No  Vitals:   11/18/22 1024  BP: 121/80  Pulse: 85       PRECAUTIONS: None  WEIGHT BEARING RESTRICTIONS: No  FALLS: Has patient fallen in last 6 months? No  PATIENT GOALS: Wants to feel more balanced. More smooth movements of his legs.   OBJECTIVE:   TODAY'S TREATMENT:  Vitals:   11/18/22 1024  BP: 121/80  Pulse: 85    NMR:  Tandem walking overground progression (2x10' UE light hold, 4x10' no UE assist) (SBA-CGA) Completed 3 steps in a row tops without UE assist  10# med ball rolls for modified SLS 3 x 20 rolls bilaterally without UE support (SBA)  - trialed with yellow ball but too compliant for patient to complete  BOSU ball balance upside down wide BOS 6 x 5-10" trials (CGA-minA)  Rocker board stands in // bars 4 x 30" (SBA-CGA)  Rocker board balance in // bars with large ball catch 2 x 10 (progressed to 1-2" eyes closed balance between tosses on second set)  SLS modified 3 x 2-19" trials in corner with chair in front for safety; patient was able to complete 18" hold L and 19" hold R x 1 trial with timer in front for motivation (SBA)   PATIENT EDUCATION: Education details: HEP safety, progress towards goals Person educated: Patient Education method: Explanation and Handouts Education comprehension: verbalized understanding  HOME EXERCISE PROGRAM:  Access Code: [email or web address] URL: [email or web address] Date: 11/11/2022 Prepared by: Maryruth Eve  Exercises - Standing Tandem Balance with Counter Support  - 1 x daily - 7 x weekly - 3 sets - 30" hold - Standing March with Counter Support  - 1 x daily - 7 x weekly - 3 sets - 10 reps - Single Leg Stance with Support  - 1 x daily - 7 x weekly - 3 sets - 10" hold - Corner Balance Feet Together: Eyes Open With Head Turns  - 1 x daily - 7 x weekly - 3 sets - 10 reps - Corner Balance Feet Together With Eyes Closed  - 1 x daily - 7 x weekly -  3 sets - 30" hold - Corner Balance Feet Together: Eyes Closed With Head Turns  - 1 x daily - 7 x weekly - 3 sets - 10 reps - Romberg Stance Eyes Closed on Foam Pad  - 1 x daily - 7 x weekly - 3 sets - 30 hold  GOALS: Goals reviewed with patient? Yes  SHORT TERM GOALS:  ALL STGS = LTGS  LONG TERM GOALS: Target date: 11/27/2022  Pt will be independent with final HEP for balance deficits in order to build upon functional gains made in therapy. Baseline:  Goal status: INITIAL  2.  Pt will improve FGA to at least a 25/30 in order to demo decr fall risk. Baseline: 20/30; 24/30 on 11/11/2022 Goal status: IN PROGRESS  3.  Pt will improve condition 2 of mCTSIB to 30 seconds for improved balance with EC.  Baseline: 17 seconds  Goal status: INITIAL  4.  Pt will improve condition 4 of mCTSIB to 12 seconds for improved vestibular input for balance.  Baseline: 2 seconds  Goal status: INITIAL   ASSESSMENT:  CLINICAL IMPRESSION: Session emphasized balance on compliant surface, narrow base of support, and modified single leg stability. Patient was also able to achieve x 18-19" holds bilaterally for the first time during SLS but will require reinforcement for consistency as many trials were still only a few seconds. Trialed BOSU balance and regressed to rocker board to improve patient's success. Pt would benefit from skilled PT to address these impairments and functional limitations to maximize functional mobility independence and decr fall risk. Continue POC.    OBJECTIVE IMPAIRMENTS: Abnormal gait, decreased balance, decreased coordination, difficulty walking, and decreased strength.   ACTIVITY LIMITATIONS: stairs and locomotion level  PARTICIPATION LIMITATIONS: community activity  PERSONAL FACTORS: Age, Past/current experiences, Time since onset of injury/illness/exacerbation, 1 comorbidity: Prediabetes, and Language barrier  are also affecting patient's functional outcome.   REHAB POTENTIAL:  Good  CLINICAL DECISION MAKING: Stable/uncomplicated  EVALUATION COMPLEXITY: Low  PLAN:  PT FREQUENCY: 2x/week  PT DURATION: 4 weeks  PLANNED INTERVENTIONS: Therapeutic exercises, Therapeutic activity, Neuromuscular re-education, Balance training, Gait training, Patient/Family education, Self Care, Stair training, Vestibular training, and Re-evaluation  PLAN FOR NEXT SESSION: Initial HEP - tandem, EC, unlevel surfaces, SLS, head motions. Work on narrow BOS tasks. Continue working on areas above in therapy session for improved balance; modified SLS tasks and higher level obstacle courses; treadmill, fall recovery; speed walking on treadmill on incline  Malachi Carl, PT, DPT  11/18/2022, 11:32 AM

## 2022-11-20 ENCOUNTER — Encounter: Payer: Self-pay | Admitting: Physical Therapy

## 2022-11-20 ENCOUNTER — Ambulatory Visit: Payer: 59 | Admitting: Physical Therapy

## 2022-11-20 DIAGNOSIS — R2689 Other abnormalities of gait and mobility: Secondary | ICD-10-CM

## 2022-11-20 DIAGNOSIS — R2681 Unsteadiness on feet: Secondary | ICD-10-CM | POA: Diagnosis not present

## 2022-11-20 NOTE — Therapy (Signed)
OUTPATIENT PHYSICAL THERAPY NEURO TREATMENT   Patient Name: Devon Schroeder MRN: [id number] DOB:Apr 14, 1966, 57 y.o., male Today's Date: 11/20/2022  PCP: Alroy Dust, L.Marlou Sa, MD REFERRING PROVIDER: Collene Leyden, MD  END OF SESSION:  PT End of Session - 11/20/22 1009     Visit Number 7    Number of Visits 9    Date for PT Re-Evaluation 11/29/22    Authorization Type [employer]    PT Start Time 1011    PT Stop Time 1052    PT Time Calculation (min) 41 min    Equipment Utilized During Treatment Gait belt    Activity Tolerance Patient tolerated treatment well    Behavior During Therapy WFL for tasks assessed/performed             Past Medical History:  Diagnosis Date   Kidney stone    History reviewed. No pertinent surgical history. Patient Active Problem List   Diagnosis Date Noted   Rash and other nonspecific skin eruption 01/23/2022   Pruritus 01/23/2022   Other atopic dermatitis 01/23/2022    ONSET DATE: 10/28/2022  REFERRING DIAG: R26.89 (ICD-10-CM) - Scissor gait  THERAPY DIAG:  Unsteadiness on feet  Other abnormalities of gait and mobility  Rationale for Evaluation and Treatment: Rehabilitation  SUBJECTIVE:                                                                                                                                                                                             SUBJECTIVE STATEMENT: Patient states he is "pretty good." He reports no falls or changes to medications. Exercises are going well at home per patient. Patient and wife Amy report that they want to work on SL balance and narrow base walking.   Pt accompanied by: self and patient refused interpretor when asked if he would like one   wife - Amy  PERTINENT HISTORY: PMH: hx of kidney stones, Prediabetes  Per Dr. 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   PAIN:  Are you having pain? No  There were no vitals filed for this visit.  PRECAUTIONS: None  WEIGHT BEARING RESTRICTIONS: No  FALLS: Has patient fallen in last 6 months? No  PATIENT GOALS: Wants to feel more balanced. More smooth movements of his legs.   OBJECTIVE:   TODAY'S TREATMENT:  There were no vitals filed for this visit.   NMR:  SLS Progression Circuit x 3 (SBA) 10# med ball roll x 1 minute bilaterally for SLS progression --> progressed to soccer ball x set 2-3 Cone taps x 1 min SLS modified 3 x 4 -52" trials in corner with chair in front for safety; patient was able to complete 8" hold L and 52" hold R x 1 trial with timer in front for motivation (SBA)   Balance Beam Progression Walking x 10' (CGA) Fwd tandem walking X 10 trials (completed without error x 4) Lateral walking x 8 trials (completed without error x 4)  PATIENT EDUCATION: Education details: HEP safety, progress towards goals; improvements in today's session Person educated: Patient Education method: Explanation and Handouts Education comprehension: verbalized understanding  HOME EXERCISE PROGRAM:  Access Code: [email or web address] URL: [email or web address] Date: 11/11/2022 Prepared by: Malachi Carl  Exercises - Standing Tandem Balance with Counter Support  - 1 x daily - 7 x weekly - 3 sets - 30" hold - Standing March with Counter Support  - 1 x daily - 7 x weekly - 3 sets - 10 reps - Single Leg Stance with Support  - 1 x daily - 7 x weekly - 3 sets - 10" hold - Corner Balance Feet Together: Eyes Open With Head Turns  - 1 x daily - 7 x weekly - 3 sets - 10 reps - Corner Balance Feet Together With Eyes Closed  - 1 x daily - 7 x weekly - 3 sets - 30" hold - Corner Balance Feet Together: Eyes Closed With Head Turns  - 1 x daily - 7 x weekly - 3  sets - 10 reps - Romberg Stance Eyes Closed on Foam Pad  - 1 x daily - 7 x weekly - 3 sets - 30 hold  GOALS: Goals reviewed with patient? Yes  SHORT TERM GOALS:  ALL STGS = LTGS  LONG TERM GOALS: Target date: 11/27/2022  Pt will be independent with final HEP for balance deficits in order to build upon functional gains made in therapy. Baseline:  Goal status: INITIAL  2.  Pt will improve FGA to at least a 25/30 in order to demo decr fall risk. Baseline: 20/30; 24/30 on 11/11/2022 Goal status: IN PROGRESS  3.  Pt will improve condition 2 of mCTSIB to 30 seconds for improved balance with EC.  Baseline: 17 seconds  Goal status: INITIAL  4.  Pt will improve condition 4 of mCTSIB to 12 seconds for improved vestibular input for balance.  Baseline: 2 seconds  Goal status: INITIAL   ASSESSMENT:  CLINICAL IMPRESSION: Session continued to emphasized and progress on balance on compliant surface, narrow base of support, and modified single leg stability. Patient was able to achieve 52" SLS stability on RLE which was a significant personal record from last time. Progressed to endurance tasks with prolonged SLS stability. Patient reported fatigue by final rep. Pt would benefit from skilled PT to address these impairments and functional limitations to maximize functional mobility independence and decr fall risk. Continue POC.    OBJECTIVE IMPAIRMENTS: Abnormal gait, decreased balance, decreased coordination, difficulty walking, and decreased strength.   ACTIVITY LIMITATIONS: stairs and locomotion level  PARTICIPATION LIMITATIONS: community activity  PERSONAL FACTORS: Age, Past/current experiences, Time since onset of injury/illness/exacerbation, 1 comorbidity: Prediabetes, and Language barrier  are also affecting patient's functional outcome.   REHAB POTENTIAL: Good  CLINICAL DECISION MAKING: Stable/uncomplicated  EVALUATION COMPLEXITY: Low  PLAN:  PT FREQUENCY: 2x/week  PT DURATION:  4 weeks  PLANNED INTERVENTIONS: Therapeutic exercises, Therapeutic activity, Neuromuscular re-education, Balance training, Gait training, Patient/Family education, Self Care, Stair training, Vestibular training, and Re-evaluation  PLAN FOR NEXT SESSION: Initial HEP - tandem, EC, unlevel surfaces, SLS, head motions. Work on narrow BOS tasks. Continue working on areas above in therapy session for improved balance; modified SLS tasks and higher level obstacle courses; treadmill, fall recovery; speed walking on treadmill on incline; final HEP  Maryruth Eve, PT, DPT  11/20/2022, 11:00 AM

## 2022-11-25 ENCOUNTER — Ambulatory Visit: Payer: 59 | Admitting: Physical Therapy

## 2022-11-25 ENCOUNTER — Encounter: Payer: Self-pay | Admitting: Physical Therapy

## 2022-11-25 VITALS — BP 130/85 | HR 84

## 2022-11-25 DIAGNOSIS — R2689 Other abnormalities of gait and mobility: Secondary | ICD-10-CM | POA: Diagnosis not present

## 2022-11-25 DIAGNOSIS — R2681 Unsteadiness on feet: Secondary | ICD-10-CM | POA: Diagnosis not present

## 2022-11-25 NOTE — Therapy (Signed)
OUTPATIENT PHYSICAL THERAPY NEURO TREATMENT   Patient Name: Devon Schroeder MRN: [id number] DOB:14-Nov-1965, 57 y.o., male Today's Date: 11/25/2022  PCP: Alroy Dust, L.Marlou Sa, MD REFERRING PROVIDER: Collene Leyden, MD  END OF SESSION:  PT End of Session - 11/25/22 1235     Visit Number 8    Number of Visits 9    Date for PT Re-Evaluation 11/29/22    Authorization Type [employer]    PT Start Time 1233    PT Stop Time 1314    PT Time Calculation (min) 41 min    Equipment Utilized During Treatment Gait belt    Activity Tolerance Patient tolerated treatment well    Behavior During Therapy WFL for tasks assessed/performed             Past Medical History:  Diagnosis Date   Kidney stone    History reviewed. No pertinent surgical history. Patient Active Problem List   Diagnosis Date Noted   Rash and other nonspecific skin eruption 01/23/2022   Pruritus 01/23/2022   Other atopic dermatitis 01/23/2022    ONSET DATE: 10/28/2022  REFERRING DIAG: R26.89 (ICD-10-CM) - Scissor gait  THERAPY DIAG:  Unsteadiness on feet  Other abnormalities of gait and mobility  Rationale for Evaluation and Treatment: Rehabilitation  SUBJECTIVE:                                                                                                                                                                                             SUBJECTIVE STATEMENT: Patient states he is doing well. No changes from last time. Tandem walking is going "so, so." Patient had insurance updated as well.   Pt accompanied by: self   PERTINENT HISTORY: PMH: hx of kidney stones, Prediabetes  Per Dr. 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   PAIN:  Are you having pain? No  Vitals:   11/25/22 1239  BP: 130/85  Pulse: 84    PRECAUTIONS: None  WEIGHT BEARING  RESTRICTIONS: No  FALLS: Has patient fallen in last 6 months? No  PATIENT GOALS: Wants to feel more balanced. More smooth movements of his legs.   OBJECTIVE:   TODAY'S TREATMENT:  Vitals:   11/25/22 1239  BP: 130/85  Pulse: 84   NMR: soccer ball rolls for modified SLS stability 1 x 30" bil --> progressed to small inflatable ball for increase compliance difficulty 2 x 30" bil SLS trials bilaterally x 5 - 6-10" holds bil SLS with re bounder and small med ball toss to self - trailed x 5 but unable to perform due to UE coordination Blaze pod fwd taps with lateral steps on airex and blue foam 3 x 60" with CGA Blaze pod tapping with narrow BOS on blue foam with 6" taps on step and laterally to ground 3  x 60" (CGA) EO horizontal head turns 2 x 30" on blue foam with NBOS (SBA) Fwd tandem walking x 6 trials x 12' (16 consecutive steps in today's session) with SBA  TherACT: Stair climbs without UE support 3 x 4 steps (SBA) 6" Curb step up x 2 bil (SBA) Ramp walking x 3 with SBA  PATIENT EDUCATION: Education details: Continue HEP, plan to D/C next session Person educated: Patient Education method: Explanation and Handouts Education comprehension: verbalized understanding  HOME EXERCISE PROGRAM:  Access Code: [email or web address] URL: [email or web address] Date: 11/11/2022 Prepared by: Maryruth Eve  Exercises - Standing Tandem Balance with Counter Support  - 1 x daily - 7 x weekly - 3 sets - 30" hold - Standing March with Counter Support  - 1 x daily - 7 x weekly - 3 sets - 10 reps - Single Leg Stance with Support  - 1 x daily - 7 x weekly - 3 sets - 10" hold - Corner Balance Feet Together: Eyes Open With Head Turns  - 1 x daily - 7 x weekly - 3 sets - 10 reps - Corner Balance Feet Together With Eyes Closed  - 1 x daily - 7 x weekly - 3 sets - 30" hold -  Corner Balance Feet Together: Eyes Closed With Head Turns  - 1 x daily - 7 x weekly - 3 sets - 10 reps - Romberg Stance Eyes Closed on Foam Pad  - 1 x daily - 7 x weekly - 3 sets - 30 hold  GOALS: Goals reviewed with patient? Yes  SHORT TERM GOALS:  ALL STGS = LTGS  LONG TERM GOALS: Target date: 11/27/2022  Pt will be independent with final HEP for balance deficits in order to build upon functional gains made in therapy. Baseline:  Goal status: INITIAL  2.  Pt will improve FGA to at least a 25/30 in order to demo decr fall risk. Baseline: 20/30; 24/30 on 11/11/2022 Goal status: IN PROGRESS  3.  Pt will improve condition 2 of mCTSIB to 30 seconds for improved balance with EC.  Baseline: 17 seconds  Goal status: INITIAL  4.  Pt will improve condition 4 of mCTSIB to 12 seconds for improved vestibular input for balance.  Baseline: 2 seconds  Goal status: INITIAL   ASSESSMENT:  CLINICAL IMPRESSION: Session continued to emphasized and progress on balance on compliant surface, narrow base of support, and modified single leg stability as well as therapeutic activities patient will encounter in real world. Patient demonstrated ability to complete 16 consecutive tandem steps without error in today's session which is a significant improvement from previous personal best of 4 consecutive steps. Patient also demonstrated ability to perform SLS for 10" bilaterally. Patient completed curb step ups, stairs without UE support, and ramp all safely with SBA. Continue POC with plans to D/C next session with updated HEP pending  progress towards goals.    OBJECTIVE IMPAIRMENTS: Abnormal gait, decreased balance, decreased coordination, difficulty walking, and decreased strength.   ACTIVITY LIMITATIONS: stairs and locomotion level  PARTICIPATION LIMITATIONS: community activity  PERSONAL FACTORS: Age, Past/current experiences, Time since onset of injury/illness/exacerbation, 1 comorbidity: Prediabetes,  and Language barrier  are also affecting patient's functional outcome.   REHAB POTENTIAL: Good  CLINICAL DECISION MAKING: Stable/uncomplicated  EVALUATION COMPLEXITY: Low  PLAN:  PT FREQUENCY: 2x/week  PT DURATION: 4 weeks  PLANNED INTERVENTIONS: Therapeutic exercises, Therapeutic activity, Neuromuscular re-education, Balance training, Gait training, Patient/Family education, Self Care, Stair training, Vestibular training, and Re-evaluation  PLAN FOR NEXT SESSION: updated HEP if necessary, review progress towards goals/possible D/C  Malachi Carl, PT, DPT  11/25/2022, 1:41 PM

## 2022-11-27 ENCOUNTER — Ambulatory Visit: Payer: 59 | Admitting: Physical Therapy

## 2022-11-27 ENCOUNTER — Encounter: Payer: Self-pay | Admitting: Physical Therapy

## 2022-11-27 VITALS — BP 137/86 | HR 82

## 2022-11-27 DIAGNOSIS — R2681 Unsteadiness on feet: Secondary | ICD-10-CM

## 2022-11-27 DIAGNOSIS — R2689 Other abnormalities of gait and mobility: Secondary | ICD-10-CM

## 2022-11-27 NOTE — Therapy (Addendum)
OUTPATIENT PHYSICAL THERAPY NEURO TREATMENT/DISCHARGE   Patient Name: Devon Schroeder MRN: [id number] DOB:1966/01/19, 57 y.o., male Today's Date: 11/27/2022  PCP: Alroy Dust, L.Marlou Sa, MD REFERRING PROVIDER: Collene Leyden, MD  END OF SESSION:  PT End of Session - 11/27/22 1233     Visit Number 9    Number of Visits 9    Date for PT Re-Evaluation 11/29/22    Authorization Type [employer]    PT Start Time 1232    PT Stop Time 1301   session time shortened as patient achieved all goals and no additional treatment indicated; patient discharged from physical therapy   PT Time Calculation (min) 29 min    Equipment Utilized During Treatment Gait belt    Activity Tolerance Patient tolerated treatment well    Behavior During Therapy WFL for tasks assessed/performed             Past Medical History:  Diagnosis Date   Kidney stone    History reviewed. No pertinent surgical history. Patient Active Problem List   Diagnosis Date Noted   Rash and other nonspecific skin eruption 01/23/2022   Pruritus 01/23/2022   Other atopic dermatitis 01/23/2022    ONSET DATE: 10/28/2022  REFERRING DIAG: R26.89 (ICD-10-CM) - Scissor gait  THERAPY DIAG:  Unsteadiness on feet  Other abnormalities of gait and mobility  Rationale for Evaluation and Treatment: Rehabilitation  SUBJECTIVE:                                                                                                                                                                                             SUBJECTIVE STATEMENT: Patient states he is doing well. No falls/medication changes. Patient is ready for discharge and feels comfortable with home program; continues to complete regular walking at home.  Pt accompanied by: self and wife- Amy  PERTINENT HISTORY: PMH: hx of kidney stones, Prediabetes  Per Dr. 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   PAIN:  Are you having pain? No  PRECAUTIONS: None  WEIGHT BEARING RESTRICTIONS: No  FALLS: Has patient fallen in last 6 months? No  PATIENT GOALS: Wants to feel more balanced. More smooth movements of his legs.   OBJECTIVE:   TODAY'S TREATMENT:  Vitals:   11/27/22 1240  BP: 137/86  Pulse: 82   NMR:   [facility] PT Assessment - 11/27/22 0001       Functional Gait  Assessment   Gait Level Surface Walks 20 ft in less than 5.5 sec, no assistive devices, good speed, no evidence for imbalance, normal gait pattern, deviates no more than 6 in outside of the 12 in walkway width.    Change in Gait Speed Able to smoothly change walking speed without loss of balance or gait deviation. Deviate no more than 6 in outside of the 12 in walkway width.    Gait with Horizontal Head Turns Performs head turns smoothly with slight change in gait velocity (eg, minor disruption to smooth gait path), deviates 6-10 in outside 12 in walkway width, or uses an assistive device.    Gait with Vertical Head Turns Performs task with slight change in gait velocity (eg, minor disruption to smooth gait path), deviates 6 - 10 in outside 12 in walkway width or uses assistive device    Gait and Pivot Turn Pivot turns safely within 3 sec and stops quickly with no loss of balance.    Step Over Obstacle Is able to step over 2 stacked shoe boxes taped together (9 in total height) without changing gait speed. No evidence of imbalance.    Gait with Narrow Base of Support Is able to ambulate for 10 steps heel to toe with no staggering.    Gait with Eyes Closed Walks 20 ft, uses assistive device, slower speed, mild gait deviations, deviates 6-10 in outside 12 in walkway width. Ambulates 20 ft in less than 9 sec but greater than 7 sec.     Ambulating Backwards Walks 20 ft, no assistive devices, good speed, no evidence for imbalance, normal gait    Steps Alternating feet, no rail.    Total Score 27            mCTSIB: 120/120 (SBA); mild sway Condition 2, mod sway Condition 3 Review of HEP briefly to ensure no questions  PATIENT EDUCATION: Education details: D/C and education on when to return to therapy if notices a regression Person educated: Patient Education method: Theatre stage manager Education comprehension: verbalized understanding  HOME EXERCISE PROGRAM:  Access Code: [email or web address] URL: [email or web address] Date: 11/11/2022 Prepared by: Malachi Carl  Exercises - Standing Tandem Balance with Counter Support  - 1 x daily - 7 x weekly - 3 sets - 30" hold - Standing March with Counter Support  - 1 x daily - 7 x weekly - 3 sets - 10 reps - Single Leg Stance with Support  - 1 x daily - 7 x weekly - 3 sets - 10" hold - Corner Balance Feet Together: Eyes Open With Head Turns  - 1 x daily - 7 x weekly - 3 sets - 10 reps - Corner Balance Feet Together With Eyes Closed  - 1 x daily - 7 x weekly - 3 sets - 30" hold - Corner Balance Feet Together: Eyes Closed With Head Turns  - 1 x daily - 7 x weekly - 3 sets - 10 reps - Romberg Stance Eyes Closed on Foam Pad  - 1 x daily - 7 x weekly - 3 sets - 30 hold  GOALS: Goals reviewed with patient? Yes  SHORT TERM GOALS:  ALL STGS = LTGS  LONG TERM GOALS: Target date: 11/27/2022  Pt will be independent with final HEP for balance deficits in order to build upon  functional gains made in therapy. Baseline:  Goal status: MET  2.  Pt will improve FGA to at least a 25/30 in order to demo decr fall risk. Baseline: 20/30; 24/30 on 11/11/2022; 27/30 on 11/27/2022 Goal status: MET  3.  Pt will improve condition 2 of mCTSIB to 30 seconds for improved balance with EC.  Baseline: 17 seconds; 30 seconds on 11/27/2022 Goal status: MET  4.  Pt will improve condition 4 of  mCTSIB to 12 seconds for improved vestibular input for balance.  Baseline: 2 seconds; improved to 30" on 11/27/2022 Goal status: MET   ASSESSMENT:  CLINICAL IMPRESSION: Patient is discharging from physical therapy today after completion of all goals. Patient met 4/4 goals and exceeded expectations on 2 of these goals. Patient is no longer at an increased risk for falls as indicated by completion of all components of mCTSIB, demonstrating good integration of vestibular, proprioceptive, and visual system. Patient also no longer at a falls risk as indicated by FGA score of 27/30. Patient has demonstrated independence and proper safety measures with HEP throughout course of therapy and demonstrates ability to continue independently. Patient is no longer in need of skilled physical therapy services at this time.   OBJECTIVE IMPAIRMENTS: Abnormal gait, decreased balance, decreased coordination, difficulty walking, and decreased strength.   ACTIVITY LIMITATIONS: stairs and locomotion level  PARTICIPATION LIMITATIONS: community activity  PERSONAL FACTORS: Age, Past/current experiences, Time since onset of injury/illness/exacerbation, 1 comorbidity: Prediabetes, and Language barrier  are also affecting patient's functional outcome.   REHAB POTENTIAL: Good  CLINICAL DECISION MAKING: Stable/uncomplicated  EVALUATION COMPLEXITY: Low  PLAN:  PT FREQUENCY: 2x/week  PT DURATION: 4 weeks  PLANNED INTERVENTIONS: Therapeutic exercises, Therapeutic activity, Neuromuscular re-education, Balance training, Gait training, Patient/Family education, Self Care, Stair training, Vestibular training, and Re-evaluation  PLAN FOR NEXT SESSION: NA - Discharge  PHYSICAL THERAPY DISCHARGE SUMMARY  Visits from Start of Care: 9  Current functional level related to goals / functional outcomes: All goals met   Remaining deficits: Mild gait deficits including wide base of support but patient able to complete daily  activities safely   Education / Equipment: When to return to therapy if notices regression, home safety  Patient agrees to discharge. Patient goals were met. Patient is being discharged due to meeting the stated rehab goals.   Maryruth Eve, PT, DPT  11/27/2022, 1:15 PM

## 2022-12-05 NOTE — Progress Notes (Signed)
Initial neurology clinic note  Devon Schroeder MRN: [id number] DOB: [birth year]  Referring provider: Collene Leyden, MD  Primary care provider: Collene Leyden, MD  Reason for consult:  imbalance  Subjective:  This is Devon Schroeder, a 57 y.o. right-handed male with a medical history of diabetes, HLD, kidney stones who presents to neurology clinic with balance problems. The patient is accompanied by wife and [place] interpreter who assists with history today.  Patient is having balance issues. Symptoms have been present for 2 years. Patient describes symptoms of soreness in legs when going up and down stairs and no problem when walking straight. Wife describes symptoms that it seemed like he was walking like an old man (short, shuffling steps). He would also appear to not be able to control his legs when walking up or down (like he was jumping). He went back to [place]. 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 His [place] doctor told him his "immunity was low." He takes a lot a vitamins as a result (vit D, C, multivitamin, B complex). He did massage and acupuncture for 2 months with improvement. Per wife, he is currently walking normal. Patient still feels some soreness in his legs. He denies feeling frozen when he walks.  Patient's talking is also slower than prior per wife (patient did not notice). He stammers in his speech more than prior (not as fluent sounding). Wife also notices that his driving is different. He has slower reaction than prior.  He denies tremors or problems smelling. He sleeps well. Wife denies any acting out of his dreams. He may be more anxious and short tempered as well.   Patient was sent to PT by PCP. He went for 1 month and feels like this helped. He is walking straighter and better balance.  Patient does not drink alcohol. He does not have a restricted diet.   MEDICATIONS:  Outpatient Encounter Medications as of  12/12/2022  Medication Sig   Ascorbic Acid (VITAMIN C) 100 MG tablet Take by mouth daily. Amount unknown   cholecalciferol (VITAMIN D3) 25 MCG (1000 UNIT) tablet Take by mouth daily. Unknown amount   cyanocobalamin (VITAMIN B12) 1000 MCG tablet Take 1,000 mcg by mouth daily.   Multiple Vitamin (MULTIVITAMIN) capsule Take 1 capsule by mouth daily.   triamcinolone ointment (KENALOG) 0.1 % Apply 1 application. topically 2 (two) times daily as needed (rash flare). Do not use on the face, neck, armpits or groin area. Do not use more than 3 weeks in a row.   cetirizine (ZYRTEC) 10 MG tablet 1 tablet (Patient not taking: Reported on 01/23/2022)   No facility-administered encounter medications on file as of 12/12/2022.    PAST MEDICAL HISTORY: Past Medical History:  Diagnosis Date   Diabetes mellitus without complication ([place])    HLD (hyperlipidemia)    Kidney stone     PAST SURGICAL HISTORY: History reviewed. No pertinent surgical history.  ALLERGIES: No Known Allergies  FAMILY HISTORY: Family History  Problem Relation Age of Onset   Eczema Father    Eczema Sister    Eczema Brother    Asthma Neg Hx    Allergic rhinitis Neg Hx    Immunodeficiency Neg Hx    Urticaria Neg Hx    Atopy Neg Hx    Angioedema Neg Hx     SOCIAL HISTORY: Social History   Tobacco Use   Smoking status: Never    Passive exposure: Never  Vaping Use   Vaping Use: Never used  Substance Use Topics   Alcohol use: No   Drug use: No   Social History   Social History Narrative   Right Handed    Lives in a two story home    Objective:  Vital Signs:  BP 131/83   Pulse 87   Ht 5\' 7"  (1.702 m)   Wt 139 lb (63 kg)   SpO2 97%   BMI 21.77 kg/m   General: General appearance: Awake and alert. No distress. Cooperative with exam.  Skin: No obvious rash or jaundice. HEENT: Atraumatic. Anicteric. Lungs: Non-labored breathing on room air  Extremities: No edema. No obvious deformity.  Musculoskeletal: No  obvious joint swelling. Psych: Affect appropriate.  Neurological: Mental Status: Alert. Speech fluent. No pseudobulbar affect Cranial Nerves: CNII: No RAPD. Visual fields intact. CNIII, IV, VI: PERRL. No nystagmus. EOMI. Choppy pursuit. CN V: Facial sensation intact bilaterally to fine touch CN VII: Facial muscles symmetric and strong. No ptosis at rest. CN VIII: Hears finger rub well bilaterally. CN IX: No hypophonia. CN X: Palate elevates symmetrically. CN XI: Full strength shoulder shrug bilaterally. CN XII: Tongue protrusion full and midline. No atrophy or fasciculations. No significant dysarthria Motor: Tone is normal. No fasciculations in extremities. No atrophy.  Individual muscle group testing (MRC grade out of 5):  Movement     Neck flexion 5    Neck extension 5     Right Left   Shoulder abduction 5 5   Elbow flexion 5 5   Elbow extension 5 5   Finger abduction - FDI 5 5   Finger abduction - ADM 5 5   Finger extension 5 5   Finger flexion 5 5    Hip flexion 5 5   Hip extension 5 5   Hip adduction 5 5   Hip abduction 5 5   Knee extension 5 5   Knee flexion 5 5   Dorsiflexion 5 5   Plantarflexion 5 5   Great toe extension 5 5   Great toe flexion 5 5     Reflexes:  Right Left   Bicep 2+ 2+   Tricep 2+ 2+   BrRad 2+ 2+   Knee 2+-3+ 2+-3+ Cross adductors bilaterally  Ankle 2+ 2+    Pathological Reflexes: Babinski: mute response bilaterally Hoffman: absent bilaterally Troemner: absent bilaterally Palmomental: absent bilaterally Facial: absent bilaterally Midline tap: absent Sensation: Pinprick: Intact in all extremities Vibration: Intact in all extremities Proprioception: Intact in bilateral great toes Coordination: Intact finger-to- nose-finger bilaterally. Romberg with mild sway. Gait: Able to rise from chair with arms crossed unassisted. Normal, narrow-based gait. Able to tandem walk. Able to walk on toes and heels.   Labs and Imaging  review: External labs: Normal or unremarkable: CBC CMP (10/27/22) significant for mildly elevated glucose (108) HbA1c (10/27/22): 6.6 TSH (09/24/21): 2.76  MRI brain w/wo contrast (08/2022 in 09/2022, hard copy of films reviewed): No clear abnormalities, but limited due to still images. No obvious brain atrophy. Posterior fossa was not well seen on films.  Assessment/Plan:  Devon Schroeder is a 57 y.o. male who presents for evaluation of imbalance. He has a relevant medical history of diabetes, HLD, kidney stones. His neurological examination is essentially normal except possible hyperreflexia at bilateral patella. There is no bradykinesia, tremor, and tone is normal. Available diagnostic data is significant for HbA1c of 6.6. The etiology of patient's symptoms are currently unclear. His history sounded possibly suggestive of  parkinsonism, but there were no signs of parkinsonism on exam. Cervical stenosis was another consideration, but no clear signs on exam. Given that patient's symptoms are improving and currently mild, I will do blood work today to look for treatable causes and monitor symptoms with follow up in 6 months (sooner if needed).   PLAN: -Blood work: B1, B12, CK, ESR, vit E, copper -May consider repeat MRI brain and possibly cervical spine if imbalance persists.  -Return to clinic in 6 months  The impression above as well as the plan as outlined below were extensively discussed with the patient (in the company of wife) who voiced understanding. All questions were answered to their satisfaction.  When available, results of the above investigations and possible further recommendations will be communicated to the patient via telephone/MyChart. Patient to call office if not contacted after expected testing turnaround time.   Total time spent reviewing records, interview, history/exam, documentation, and coordination of care on day of encounter:  60 min   Thank you for allowing me to  participate in patient's care.  If I can answer any additional questions, I would be pleased to do so.  Kai Levins, MD   CC: Collene Leyden, MD [address] [place] [address]  CC: Referring provider: Collene Leyden, MD [address] [place],  [place] [id number]

## 2022-12-09 DIAGNOSIS — R21 Rash and other nonspecific skin eruption: Secondary | ICD-10-CM | POA: Diagnosis not present

## 2022-12-09 DIAGNOSIS — L299 Pruritus, unspecified: Secondary | ICD-10-CM | POA: Diagnosis not present

## 2022-12-12 ENCOUNTER — Other Ambulatory Visit (INDEPENDENT_AMBULATORY_CARE_PROVIDER_SITE_OTHER): Payer: 59

## 2022-12-12 ENCOUNTER — Ambulatory Visit: Payer: 59 | Admitting: Neurology

## 2022-12-12 ENCOUNTER — Encounter: Payer: Self-pay | Admitting: Neurology

## 2022-12-12 VITALS — BP 131/83 | HR 87 | Ht 67.0 in | Wt 139.0 lb

## 2022-12-12 DIAGNOSIS — R269 Unspecified abnormalities of gait and mobility: Secondary | ICD-10-CM

## 2022-12-12 DIAGNOSIS — R4189 Other symptoms and signs involving cognitive functions and awareness: Secondary | ICD-10-CM

## 2022-12-12 DIAGNOSIS — R2689 Other abnormalities of gait and mobility: Secondary | ICD-10-CM

## 2022-12-12 DIAGNOSIS — R4789 Other speech disturbances: Secondary | ICD-10-CM | POA: Diagnosis not present

## 2022-12-12 DIAGNOSIS — R292 Abnormal reflex: Secondary | ICD-10-CM

## 2022-12-12 DIAGNOSIS — M791 Myalgia, unspecified site: Secondary | ICD-10-CM

## 2022-12-12 LAB — CK: Total CK: 91 U/L (ref 7–232)

## 2022-12-12 LAB — VITAMIN B12: Vitamin B-12: 817 pg/mL (ref 211–911)

## 2022-12-12 LAB — SEDIMENTATION RATE: Sed Rate: 5 mm/hr (ref 0–20)

## 2022-12-12 NOTE — Patient Instructions (Signed)
I want to get blood work today.  I will be in touch when I have your results.  I want to see you back in clinic in 6 months or sooner if needed.  Please let me know if you have any questions or concerns in the meantime.   The physicians and staff at [facility] are committed to providing excellent care. You may receive a survey requesting feedback about your experience at our office. We strive to receive "very good" responses to the survey questions. If you feel that your experience would prevent you from giving the office a "very good " response, please contact our office to try to remedy the situation. We may be reached at [phone]. Thank you for taking the time out of your busy day to complete the survey.  Kai Levins, MD [facility]

## 2022-12-16 LAB — VITAMIN E
Gamma-Tocopherol (Vit E): <1 mg/L (ref <=4.3)
Vitamin E (Alpha Tocopherol): 13.8 mg/L (ref 5.7–19.9)

## 2022-12-16 LAB — VITAMIN B1: Vitamin B1 (Thiamine): 23 nmol/L (ref 8–30)

## 2022-12-16 LAB — COPPER, SERUM: Copper: 75 ug/dL (ref 70–175)

## 2023-03-09 DIAGNOSIS — L309 Dermatitis, unspecified: Secondary | ICD-10-CM | POA: Diagnosis not present

## 2023-03-31 DIAGNOSIS — L309 Dermatitis, unspecified: Secondary | ICD-10-CM | POA: Diagnosis not present

## 2023-04-07 DIAGNOSIS — R972 Elevated prostate specific antigen [PSA]: Secondary | ICD-10-CM | POA: Diagnosis not present

## 2023-04-08 DIAGNOSIS — R112 Nausea with vomiting, unspecified: Secondary | ICD-10-CM | POA: Diagnosis not present

## 2023-04-14 DIAGNOSIS — N200 Calculus of kidney: Secondary | ICD-10-CM | POA: Diagnosis not present

## 2023-04-14 DIAGNOSIS — R972 Elevated prostate specific antigen [PSA]: Secondary | ICD-10-CM | POA: Diagnosis not present

## 2023-04-23 DIAGNOSIS — L2089 Other atopic dermatitis: Secondary | ICD-10-CM | POA: Diagnosis not present

## 2023-04-28 DIAGNOSIS — Z23 Encounter for immunization: Secondary | ICD-10-CM | POA: Diagnosis not present

## 2023-05-07 DIAGNOSIS — R21 Rash and other nonspecific skin eruption: Secondary | ICD-10-CM | POA: Diagnosis not present

## 2023-05-07 DIAGNOSIS — N400 Enlarged prostate without lower urinary tract symptoms: Secondary | ICD-10-CM | POA: Diagnosis not present

## 2023-05-07 DIAGNOSIS — E785 Hyperlipidemia, unspecified: Secondary | ICD-10-CM | POA: Diagnosis not present

## 2023-05-07 DIAGNOSIS — R972 Elevated prostate specific antigen [PSA]: Secondary | ICD-10-CM | POA: Diagnosis not present

## 2023-05-07 DIAGNOSIS — R2689 Other abnormalities of gait and mobility: Secondary | ICD-10-CM | POA: Diagnosis not present

## 2023-05-07 DIAGNOSIS — R7303 Prediabetes: Secondary | ICD-10-CM | POA: Diagnosis not present

## 2023-06-01 DIAGNOSIS — L2089 Other atopic dermatitis: Secondary | ICD-10-CM | POA: Diagnosis not present

## 2023-06-04 DIAGNOSIS — L2089 Other atopic dermatitis: Secondary | ICD-10-CM | POA: Diagnosis not present

## 2023-06-10 NOTE — Progress Notes (Signed)
NEUROLOGY FOLLOW UP OFFICE NOTE  Devon Schroeder [id number]  Subjective:  Devon Schroeder is a 57 y.o. year old right-handed male with a medical history of diabetes, HLD, kidney stones who we last saw on 12/12/22.  To briefly review: Initial consultation (2/2/4): Patient is having balance issues. Symptoms have been present for 2 years. Patient describes symptoms of soreness in legs when going up and down stairs and no problem when walking straight. Wife describes symptoms that it seemed like he was walking like an old man (short, shuffling steps). He would also appear to not be able to control his legs when walking up or down (like he was jumping). He went back to [place]. 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 His [place] doctor told him his "immunity was low." He takes a lot a vitamins as a result (vit D, C, multivitamin, B complex). He did massage and acupuncture for 2 months with improvement. Per wife, he is currently walking normal. Patient still feels some soreness in his legs. He denies feeling frozen when he walks.   Patient's talking is also slower than prior per wife (patient did not notice). He stammers in his speech more than prior (not as fluent sounding). Wife also notices that his driving is different. He has slower reaction than prior.   He denies tremors or problems smelling. He sleeps well. Wife denies any acting out of his dreams. He may be more anxious and short tempered as well.    Patient was sent to PT by PCP. He went for 1 month and feels like this helped. He is walking straighter and better balance.   Patient does not drink alcohol. He does not have a restricted diet.  Most recent Assessment and Plan (12/12/22): Devon Schroeder is a 57 y.o. male who presents for evaluation of imbalance. He has a relevant medical history of diabetes, HLD, kidney stones. His neurological examination is essentially normal except possible  hyperreflexia at bilateral patella. There is no bradykinesia, tremor, and tone is normal. Available diagnostic data is significant for HbA1c of 6.6. The etiology of patient's symptoms are currently unclear. His history sounded possibly suggestive of parkinsonism, but there were no signs of parkinsonism on exam. Cervical stenosis was another consideration, but no clear signs on exam. Given that patient's symptoms are improving and currently mild, I will do blood work today to look for treatable causes and monitor symptoms with follow up in 6 months (sooner if needed).    PLAN: -Blood work: B1, B12, CK, ESR, vit E, copper -May consider repeat MRI brain and possibly cervical spine if imbalance persists.   Since their last visit: Lab work was unremarkable.  Patient mentions that he feels like his legs are heavy sometimes. He denies significant imbalance or falls. He thinks he was getting better, but now does not feel as well. He mentions needing to hold on to the rail to help him get up and down the stairs. He points at his calves and says, "low power." Normal walking is okay. He denies back pain, leg pain, numbness, or tingling. He feels like if he stands too long, his legs will get tired. Sitting down will help. He denies freezing. He denies tremors.  He did some physical therapy about 6 months ago. He has not been doing the exercises, but he does walk in the park every day.   MEDICATIONS:  Outpatient Encounter Medications as of 06/17/2023  Medication  Sig   Ascorbic Acid (VITAMIN C) 100 MG tablet Take by mouth daily. Amount unknown   cholecalciferol (VITAMIN D3) 25 MCG (1000 UNIT) tablet Take by mouth daily. Unknown amount   Multiple Vitamin (MULTIVITAMIN) capsule Take 1 capsule by mouth daily.   triamcinolone ointment (KENALOG) 0.1 % Apply 1 application. topically 2 (two) times daily as needed (rash flare). Do not use on the face, neck, armpits or groin area. Do not use more than 3 weeks in a row.    cetirizine (ZYRTEC) 10 MG tablet 1 tablet (Patient not taking: Reported on 01/23/2022)   cyanocobalamin (VITAMIN B12) 1000 MCG tablet Take 1,000 mcg by mouth daily. (Patient not taking: Reported on 06/17/2023)   No facility-administered encounter medications on file as of 06/17/2023.    PAST MEDICAL HISTORY: Past Medical History:  Diagnosis Date   Diabetes mellitus without complication ([facility])    HLD (hyperlipidemia)    Kidney stone     PAST SURGICAL HISTORY: History reviewed. No pertinent surgical history.  ALLERGIES: No Known Allergies  FAMILY HISTORY: Family History  Problem Relation Age of Onset   Eczema Father    Eczema Sister    Eczema Brother    Asthma Neg Hx    Allergic rhinitis Neg Hx    Immunodeficiency Neg Hx    Urticaria Neg Hx    Atopy Neg Hx    Angioedema Neg Hx     SOCIAL HISTORY: Social History   Tobacco Use   Smoking status: Never    Passive exposure: Never  Vaping Use   Vaping status: Never Used  Substance Use Topics   Alcohol use: No   Drug use: No   Social History   Social History Narrative   Right Handed    Lives in a two story home   Are you right handed or left handed? rIGHT   Are you currently employed ?    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   Who lives with you?    What type of home do you live in: 1 story or 2 story? TWO          Objective:  Vital Signs:  BP 122/80   Pulse (!) 58   Ht 5\' 8"  (1.727 m)   Wt 135 lb (61.2 kg)   SpO2 98%   BMI 20.53 kg/m   General: No acute distress.  Patient appears well-groomed.   Head:  Normocephalic/atraumatic Neck: supple Lungs: Non-labored breathing on room air   Neurological Exam: Mental status: alert and oriented, speech fluent and not dysarthric, language intact.  Cranial nerves: CN I: not tested CN II: pupils equal, round and reactive to light, visual fields intact CN III, IV, VI:  full range of motion, no nystagmus, no ptosis CN V: facial  sensation intact. CN VII: upper and lower face symmetric CN VIII: hearing intact CN IX, X: uvula midline CN XI: sternocleidomastoid and trapezius muscles intact CN XII: tongue midline  Bulk & Tone: normal, no fasciculations. Motor:  muscle strength 5/5 throughout Deep Tendon Reflexes:  2+ throughout,  toes downgoing.   Sensation:  Pinprick sensation intact. Finger to nose testing:  Without dysmetria.   Coordination: Finger tapping and toe tapping are normal bilaterally Gait:  Normal station and stride.  Romberg negative.   Labs and Imaging review: New results: 12/12/22: B1 wnl B12: 817 CK: 91 ESR wnl Vit E wnl Copper wnl  Previously reviewed results: Normal or unremarkable: CBC CMP (  10/27/22) significant for mildly elevated glucose (108) HbA1c (10/27/22): 6.6 TSH (09/24/21): 2.76   MRI brain w/wo contrast (08/2022 in [place], hard copy of films reviewed): No clear abnormalities, but limited due to still images. No obvious brain atrophy. Posterior fossa was not well seen on films.  Assessment/Plan:  This is Devon Schroeder, a 57 y.o. male with leg heaviness and ?imbalance. His examination and work up has been negative. There is no evidence of parkinsonism, weakness, or any other neurologic abnormality on examination. I encouraged patient to continue staying active and to follow up with new or worsening symptoms.  Plan: -Continue to stay active -Recommend home exercises given by PT previously -No obvious indication for further neurologic work up at this time  Return to clinic as needed  Total time spent reviewing records, interview, history/exam, documentation, and coordination of care on day of encounter:  20 min  Jacquelyne Balint, MD

## 2023-06-17 ENCOUNTER — Encounter: Payer: Self-pay | Admitting: Neurology

## 2023-06-17 ENCOUNTER — Ambulatory Visit (INDEPENDENT_AMBULATORY_CARE_PROVIDER_SITE_OTHER): Payer: 59 | Admitting: Neurology

## 2023-06-17 VITALS — BP 122/80 | HR 58 | Ht 68.0 in | Wt 135.0 lb

## 2023-06-17 DIAGNOSIS — R2689 Other abnormalities of gait and mobility: Secondary | ICD-10-CM | POA: Diagnosis not present

## 2023-07-09 DIAGNOSIS — R2689 Other abnormalities of gait and mobility: Secondary | ICD-10-CM | POA: Diagnosis not present

## 2023-07-09 DIAGNOSIS — R040 Epistaxis: Secondary | ICD-10-CM | POA: Diagnosis not present

## 2023-07-09 DIAGNOSIS — L299 Pruritus, unspecified: Secondary | ICD-10-CM | POA: Diagnosis not present

## 2023-07-23 DIAGNOSIS — L309 Dermatitis, unspecified: Secondary | ICD-10-CM | POA: Diagnosis not present

## 2024-05-27 ENCOUNTER — Encounter: Payer: Self-pay | Admitting: Advanced Practice Midwife
